# Patient Record
Sex: Female | Born: 1935 | Race: Black or African American | Hispanic: No | Smoking: Former smoker
Health system: Southern US, Community
[De-identification: ages and names within clinical notes are randomized; demographics above are authoritative.]

## PROBLEM LIST (undated history)

## (undated) DIAGNOSIS — E119 Type 2 diabetes mellitus without complications: Secondary | ICD-10-CM

## (undated) DIAGNOSIS — I10 Essential (primary) hypertension: Secondary | ICD-10-CM

## (undated) DIAGNOSIS — I639 Cerebral infarction, unspecified: Secondary | ICD-10-CM

## (undated) DIAGNOSIS — E785 Hyperlipidemia, unspecified: Secondary | ICD-10-CM

## (undated) DIAGNOSIS — K922 Gastrointestinal hemorrhage, unspecified: Secondary | ICD-10-CM

## (undated) DIAGNOSIS — I2510 Atherosclerotic heart disease of native coronary artery without angina pectoris: Secondary | ICD-10-CM

## (undated) HISTORY — PX: CORONARY ARTERY BYPASS GRAFT: SHX141

## (undated) HISTORY — PX: ABDOMINAL HYSTERECTOMY: SHX81

## (undated) HISTORY — PX: CORONARY ANGIOPLASTY WITH STENT PLACEMENT: SHX49

## (undated) SURGERY — MINOR CAPSULOTOMY
Anesthesia: Topical | Laterality: Left

---

## 1998-01-03 ENCOUNTER — Inpatient Hospital Stay (HOSPITAL_COMMUNITY): Admission: EM | Admit: 1998-01-03 | Discharge: 1998-01-09 | Payer: Self-pay | Admitting: Emergency Medicine

## 1998-08-21 ENCOUNTER — Inpatient Hospital Stay (HOSPITAL_COMMUNITY): Admission: AD | Admit: 1998-08-21 | Discharge: 1998-08-22 | Payer: Self-pay | Admitting: *Deleted

## 1998-11-06 ENCOUNTER — Ambulatory Visit (HOSPITAL_COMMUNITY): Admission: RE | Admit: 1998-11-06 | Discharge: 1998-11-06 | Payer: Self-pay | Admitting: Cardiology

## 1998-11-06 ENCOUNTER — Encounter: Payer: Self-pay | Admitting: Cardiology

## 1999-02-13 ENCOUNTER — Inpatient Hospital Stay (HOSPITAL_COMMUNITY): Admission: AD | Admit: 1999-02-13 | Discharge: 1999-02-25 | Payer: Self-pay | Admitting: Cardiology

## 1999-02-18 ENCOUNTER — Encounter: Payer: Self-pay | Admitting: Thoracic Surgery (Cardiothoracic Vascular Surgery)

## 1999-02-19 ENCOUNTER — Encounter: Payer: Self-pay | Admitting: Cardiology

## 1999-02-20 ENCOUNTER — Encounter: Payer: Self-pay | Admitting: Cardiology

## 1999-02-21 ENCOUNTER — Encounter: Payer: Self-pay | Admitting: Thoracic Surgery (Cardiothoracic Vascular Surgery)

## 1999-02-22 ENCOUNTER — Encounter: Payer: Self-pay | Admitting: Thoracic Surgery (Cardiothoracic Vascular Surgery)

## 2002-03-04 ENCOUNTER — Encounter: Payer: Self-pay | Admitting: Cardiology

## 2002-03-04 ENCOUNTER — Ambulatory Visit (HOSPITAL_COMMUNITY): Admission: RE | Admit: 2002-03-04 | Discharge: 2002-03-04 | Payer: Self-pay | Admitting: Cardiology

## 2002-03-10 ENCOUNTER — Ambulatory Visit (HOSPITAL_COMMUNITY): Admission: RE | Admit: 2002-03-10 | Discharge: 2002-03-10 | Payer: Self-pay | Admitting: Cardiology

## 2003-03-21 ENCOUNTER — Ambulatory Visit (HOSPITAL_COMMUNITY): Admission: RE | Admit: 2003-03-21 | Discharge: 2003-03-21 | Payer: Self-pay | Admitting: Gastroenterology

## 2003-03-21 ENCOUNTER — Encounter: Payer: Self-pay | Admitting: Gastroenterology

## 2003-03-22 ENCOUNTER — Encounter (HOSPITAL_COMMUNITY): Admission: RE | Admit: 2003-03-22 | Discharge: 2003-06-20 | Payer: Self-pay | Admitting: Gastroenterology

## 2003-03-29 ENCOUNTER — Encounter (INDEPENDENT_AMBULATORY_CARE_PROVIDER_SITE_OTHER): Payer: Self-pay | Admitting: Specialist

## 2003-03-29 ENCOUNTER — Ambulatory Visit (HOSPITAL_COMMUNITY): Admission: RE | Admit: 2003-03-29 | Discharge: 2003-03-29 | Payer: Self-pay | Admitting: Gastroenterology

## 2004-09-07 ENCOUNTER — Emergency Department (HOSPITAL_COMMUNITY): Admission: EM | Admit: 2004-09-07 | Discharge: 2004-09-07 | Payer: Self-pay | Admitting: *Deleted

## 2005-01-20 ENCOUNTER — Ambulatory Visit (HOSPITAL_COMMUNITY): Admission: RE | Admit: 2005-01-20 | Discharge: 2005-01-20 | Payer: Self-pay | Admitting: Cardiology

## 2005-02-06 ENCOUNTER — Ambulatory Visit (HOSPITAL_COMMUNITY): Admission: RE | Admit: 2005-02-06 | Discharge: 2005-02-06 | Payer: Self-pay | Admitting: Cardiology

## 2005-12-11 ENCOUNTER — Emergency Department (HOSPITAL_COMMUNITY): Admission: EM | Admit: 2005-12-11 | Discharge: 2005-12-11 | Payer: Self-pay | Admitting: Emergency Medicine

## 2005-12-22 ENCOUNTER — Inpatient Hospital Stay (HOSPITAL_COMMUNITY): Admission: EM | Admit: 2005-12-22 | Discharge: 2005-12-24 | Payer: Self-pay | Admitting: Emergency Medicine

## 2006-09-20 ENCOUNTER — Inpatient Hospital Stay (HOSPITAL_COMMUNITY): Admission: EM | Admit: 2006-09-20 | Discharge: 2006-09-21 | Payer: Self-pay | Admitting: Emergency Medicine

## 2006-11-07 ENCOUNTER — Inpatient Hospital Stay (HOSPITAL_COMMUNITY): Admission: EM | Admit: 2006-11-07 | Discharge: 2006-11-10 | Payer: Self-pay | Admitting: Emergency Medicine

## 2006-11-09 ENCOUNTER — Encounter (INDEPENDENT_AMBULATORY_CARE_PROVIDER_SITE_OTHER): Payer: Self-pay | Admitting: Cardiology

## 2007-11-17 ENCOUNTER — Emergency Department (HOSPITAL_COMMUNITY): Admission: EM | Admit: 2007-11-17 | Discharge: 2007-11-17 | Payer: Self-pay | Admitting: Emergency Medicine

## 2008-03-28 ENCOUNTER — Ambulatory Visit (HOSPITAL_COMMUNITY): Admission: RE | Admit: 2008-03-28 | Discharge: 2008-03-28 | Payer: Self-pay | Admitting: Ophthalmology

## 2008-04-05 ENCOUNTER — Encounter: Admission: RE | Admit: 2008-04-05 | Discharge: 2008-04-05 | Payer: Self-pay | Admitting: Gastroenterology

## 2008-04-10 ENCOUNTER — Ambulatory Visit (HOSPITAL_BASED_OUTPATIENT_CLINIC_OR_DEPARTMENT_OTHER): Admission: RE | Admit: 2008-04-10 | Discharge: 2008-04-10 | Payer: Self-pay | Admitting: Ophthalmology

## 2008-04-27 ENCOUNTER — Ambulatory Visit (HOSPITAL_COMMUNITY): Admission: RE | Admit: 2008-04-27 | Discharge: 2008-04-27 | Payer: Self-pay | Admitting: Ophthalmology

## 2008-08-04 ENCOUNTER — Ambulatory Visit (HOSPITAL_COMMUNITY): Admission: RE | Admit: 2008-08-04 | Discharge: 2008-08-04 | Payer: Self-pay | Admitting: Ophthalmology

## 2008-11-06 ENCOUNTER — Ambulatory Visit (HOSPITAL_COMMUNITY): Admission: RE | Admit: 2008-11-06 | Discharge: 2008-11-06 | Payer: Self-pay | Admitting: Ophthalmology

## 2009-02-26 ENCOUNTER — Emergency Department (HOSPITAL_COMMUNITY): Admission: EM | Admit: 2009-02-26 | Discharge: 2009-02-26 | Payer: Self-pay | Admitting: Emergency Medicine

## 2009-06-01 ENCOUNTER — Encounter (HOSPITAL_COMMUNITY): Admission: RE | Admit: 2009-06-01 | Discharge: 2009-07-27 | Payer: Self-pay | Admitting: Cardiology

## 2010-03-05 ENCOUNTER — Inpatient Hospital Stay (HOSPITAL_BASED_OUTPATIENT_CLINIC_OR_DEPARTMENT_OTHER): Admission: RE | Admit: 2010-03-05 | Discharge: 2010-03-05 | Payer: Self-pay | Admitting: Cardiology

## 2010-09-16 ENCOUNTER — Emergency Department (HOSPITAL_COMMUNITY)
Admission: EM | Admit: 2010-09-16 | Discharge: 2010-09-16 | Payer: Self-pay | Source: Home / Self Care | Admitting: Emergency Medicine

## 2010-12-09 LAB — POCT I-STAT GLUCOSE: Operator id: 221371

## 2010-12-31 ENCOUNTER — Emergency Department (HOSPITAL_COMMUNITY): Payer: Medicare Other

## 2010-12-31 ENCOUNTER — Emergency Department (HOSPITAL_COMMUNITY)
Admission: EM | Admit: 2010-12-31 | Discharge: 2010-12-31 | Disposition: A | Discharge status: Home / Self Care | Payer: Medicare Other | Attending: Emergency Medicine | Admitting: Emergency Medicine

## 2010-12-31 DIAGNOSIS — E789 Disorder of lipoprotein metabolism, unspecified: Secondary | ICD-10-CM | POA: Insufficient documentation

## 2010-12-31 DIAGNOSIS — Z8673 Personal history of transient ischemic attack (TIA), and cerebral infarction without residual deficits: Secondary | ICD-10-CM | POA: Insufficient documentation

## 2010-12-31 DIAGNOSIS — I10 Essential (primary) hypertension: Secondary | ICD-10-CM | POA: Insufficient documentation

## 2010-12-31 DIAGNOSIS — R0609 Other forms of dyspnea: Secondary | ICD-10-CM | POA: Insufficient documentation

## 2010-12-31 DIAGNOSIS — Z951 Presence of aortocoronary bypass graft: Secondary | ICD-10-CM | POA: Insufficient documentation

## 2010-12-31 DIAGNOSIS — I2510 Atherosclerotic heart disease of native coronary artery without angina pectoris: Secondary | ICD-10-CM | POA: Insufficient documentation

## 2010-12-31 DIAGNOSIS — Z79899 Other long term (current) drug therapy: Secondary | ICD-10-CM | POA: Insufficient documentation

## 2010-12-31 DIAGNOSIS — E119 Type 2 diabetes mellitus without complications: Secondary | ICD-10-CM | POA: Insufficient documentation

## 2010-12-31 DIAGNOSIS — R0989 Other specified symptoms and signs involving the circulatory and respiratory systems: Secondary | ICD-10-CM | POA: Insufficient documentation

## 2010-12-31 DIAGNOSIS — M6281 Muscle weakness (generalized): Secondary | ICD-10-CM | POA: Insufficient documentation

## 2010-12-31 DIAGNOSIS — Z7982 Long term (current) use of aspirin: Secondary | ICD-10-CM | POA: Insufficient documentation

## 2010-12-31 DIAGNOSIS — R112 Nausea with vomiting, unspecified: Secondary | ICD-10-CM | POA: Insufficient documentation

## 2010-12-31 DIAGNOSIS — R42 Dizziness and giddiness: Secondary | ICD-10-CM | POA: Insufficient documentation

## 2010-12-31 DIAGNOSIS — I252 Old myocardial infarction: Secondary | ICD-10-CM | POA: Insufficient documentation

## 2010-12-31 LAB — URINE MICROSCOPIC-ADD ON

## 2010-12-31 LAB — CBC
Hemoglobin: 10.6 g/dL — ABNORMAL LOW (ref 12.0–15.0)
MCV: 67.3 fL — ABNORMAL LOW (ref 78.0–100.0)
RBC: 5.14 MIL/uL — ABNORMAL HIGH (ref 3.87–5.11)

## 2010-12-31 LAB — URINALYSIS, ROUTINE W REFLEX MICROSCOPIC
Glucose, UA: NEGATIVE mg/dL
Hgb urine dipstick: NEGATIVE
Urobilinogen, UA: 0.2 mg/dL (ref 0.0–1.0)

## 2010-12-31 LAB — POCT CARDIAC MARKERS
CKMB, poc: 1.6 ng/mL (ref 1.0–8.0)
Myoglobin, poc: 63.4 ng/mL (ref 12–200)

## 2010-12-31 LAB — COMPREHENSIVE METABOLIC PANEL
AST: 31 U/L (ref 0–37)
Alkaline Phosphatase: 67 U/L (ref 39–117)
CO2: 20 mEq/L (ref 19–32)
Chloride: 108 mEq/L (ref 96–112)
GFR calc non Af Amer: 51 mL/min — ABNORMAL LOW (ref >60)
Glucose, Bld: 206 mg/dL — ABNORMAL HIGH (ref 70–99)
Potassium: 4.1 mEq/L (ref 3.5–5.1)
Sodium: 137 mEq/L (ref 135–145)
Total Protein: 7.6 g/dL (ref 6.0–8.3)

## 2010-12-31 LAB — PROTIME-INR
INR: 1.15 (ref 0.00–1.49)
Prothrombin Time: 14.9 seconds (ref 11.6–15.2)

## 2010-12-31 LAB — DIFFERENTIAL
Basophils Relative: 1 % (ref 0–1)
Lymphocytes Relative: 26 % (ref 12–46)
Monocytes Relative: 6 % (ref 3–12)
Neutro Abs: 3.7 10*3/uL (ref 1.7–7.7)

## 2010-12-31 LAB — POCT I-STAT, CHEM 8
Chloride: 107 mEq/L (ref 96–112)
Creatinine, Ser: 1.1 mg/dL (ref 0.4–1.2)
Hemoglobin: 12.6 g/dL (ref 12.0–15.0)
Sodium: 141 mEq/L (ref 135–145)

## 2010-12-31 MED ORDER — GADOBENATE DIMEGLUMINE 529 MG/ML IV SOLN
15.0000 mL | Freq: Once | INTRAVENOUS | Status: AC
  Administered 2010-12-31: 15 mL via INTRAVENOUS

## 2011-02-07 NOTE — Op Note (Signed)
[name], [name]:  [id number]   MEDICAL RECORD NO.:  [id number]          PATIENT TYPE:  AMB   LOCATION:  SDS                          FACILITY:  [facility]   PHYSICIAN:  Salley Scarlet., M.D.DATE OF BIRTH:  08/06/1931   DATE OF PROCEDURE:  DATE OF DISCHARGE:                               OPERATIVE REPORT   PREOPERATIVE DIAGNOSIS:  Immature cataract, right eye.   POSTOPERATIVE DIAGNOSIS:  Immature cataract, right eye.   OPERATION:  Kelman phacoemulsification cataract, right eye.   ANESTHESIA:  Local using Xylocaine 2% and Marcaine 0.75%.   JUSTIFICATION FOR PROCEDURE:  This is a 77 year old lady who underwent a  cataract extraction from the left eye several months ago.  She still  complains of blurred vision with difficulty to see and read.  She was  evaluated and found to have a visual acuity best corrected 20/50 on the  right, 20/30 on the left.  There was a 2+ nuclear sclerotic cataract of  the right eye with intraocular lens present on the left.  Cataract  extraction with intraocular lens implantation was recommended, and she  is admitted at this time for that purpose.   PROCEDURE:  Under influence of IV sedation Van Lint akinesia and  retrobulbar anesthesia was given.  The patient was prepped and draped in  the usual manner.  The lid speculum was inserted under the upper and  lower lid of the right eye and a 4-0 silk traction suture was passed  through the belly of the superior rectus muscle retraction.  A fornix-  based conjunctival flap was turned and hemostasis achieved using  cautery.  An incision made in the sclera at the limbus.  This incision  was dissected down into the cornea using a crescent blade.  A sideport  incision was made at 1:30 o'clock position.  OcuCoat was injected into  the eye through the sideport incision.  The anterior chamber was entered  through the corneoscleral tunnel incision at 11:35 o' clock position,  using a  2.75-mm keratome.  An anterior capsulotomy was done using a bent  25-gauge needle.  The nucleus was hydrodissected using Xylocaine.  The  KPE handpiece was passed into the eye and the nucleus was emulsified  without difficulty.  The residual cortical material was aspirated.  The  posterior capsule was polished using Olive Tip polisher.  The wound was  widened slightly to accommodate a foldable silicone lens.  The lens was  seated into the eye behind iris without difficulty.  The anterior  chamber was reformed and pupil constricted using Miochol.  The lips of  the wound were hydrated and tested to make sure that there was no leak.  After ascertaining that there was no leak, the conjunctiva was closed  over the wound using a thermal cautery.  About 1 mL of Celestone and 0.5  mL of gentamicin were injected into the subconjunctiva.  Maxitrol  ophthalmic ointment and prilocaine ointment were applied along with a  patch and Fox shield.  The patient tolerated the procedure well and was  discharged to the post anesthesia recovery  room in satisfactory condition.  She is instructed to rest today, to  take Darvocet-N 100 every 4 hours as needed for pain, and to see me in  office tomorrow for further evaluation.   DISCHARGE DIAGNOSIS:  Immature cataract, right eye.      Salley Scarlet., M.D.  Electronically Signed     TB/MEDQ  D:  08/05/2008  T:  08/05/2008  Job:  [id number]

## 2011-02-07 NOTE — Op Note (Signed)
[name], [name]:  [id number]   MEDICAL RECORD NO.:  [id number]          PATIENT TYPE:  AMB   LOCATION:  SDS                          FACILITY:  [facility]   PHYSICIAN:  Salley Scarlet., M.D.DATE OF BIRTH:  August 24, 1931   DATE OF PROCEDURE:  05/03/2008  DATE OF DISCHARGE:  04/27/2008                               OPERATIVE REPORT   PREOPERATIVE DIAGNOSIS:  Immature cataract left eye.   POSTOPERATIVE DIAGNOSIS:  Immature cataract left eye.   OPERATION:  Kelman phacoemulsification cataract left eye.   ANESTHESIA:  Local using Xylocaine 2% or Marcaine 0.75% with W[place]ase.   JUSTIFICATION FOR PROCEDURE:  This is a 77 year old lady who complains  of blurring of vision with difficulty seeing and read.  She is evaluated  and found to have a visual acuity best corrected to 20/50 on the right  and 20/60 on the left.  There were bilateral immature cataracts slightly  worse on the left than the right.  Cataract extraction with intraocular  lens implantation was recommended.  She is admitted at this time for  that purpose.   PROCEDURE:  Under influence of IV sedation of Van Lint akinesia,  retrobulbar anesthesia was given.  The patient was prepped and draped in  usual manner.  A lid speculum was inserted under the upper and lower lid  of the left eye and a 4-0 silk traction suture was passed through the  belly of the superior rectus muscle retraction.  A fornix-based  conjunctival flap was turned and hemostasis achieved using cautery.  An  incision was made in the sclera at the limbus.  This incision was  dissected down to clear cornea using crescent blade.  A sideport  incision made at 1:30 position.  OcuCoat was injected into the eye  through the sideport incision.  The anterior chamber was entered through  the corneoscleral tunnel incision at 11:30 position and an anterior  capsulotomy was done using a bent 25-gauge needle.  The nucleus was  hydrodissected  using Xylocaine.  The KPE handpiece was passed into the  eye.  The nucleus was emulsified without difficulty.  The residual  cortical material was aspirated.  The posterior capsule was polished  using olive-tip polisher.  The wound was widened slightly to accommodate  a foldable silicone lens.  The lens was seated into the eye behind the  iris without difficulty.  The anterior chamber was reformed and pupil  constricted using Miochol.  The lips of the wound was hydrated and  tested to make sure that there was no leak.  After ascertaining that  there was no leak, the conjunctiva was closed over the wound using  thermal cautery.  Celestone 1 mL and 0.5 mL gentamicin were injected  subconjunctivally.  Maxitrol ophthalmic ointment and prilocaine ointment  were applied along with a patch and Fox shield.  The patient tolerated  the procedure well and was discharged to the post anesthesia recovery  room in satisfactory condition.  She is instructed to rest today, to  take Vicodin every 4 hours as needed for pain and see me in the office  tomorrow for further evaluation.   DISCHARGED DIAGNOSIS:  Immature cataract left eye.     Salley Scarlet., M.D.  Electronically Signed    TB/MEDQ  D:  05/03/2008  T:  05/03/2008  Job:  [id number]

## 2011-02-07 NOTE — Cardiovascular Report (Signed)
NAMESADAE, [name]:  [id number]   MEDICAL RECORD NO.:  [id number]          PATIENT TYPE:  INP   LOCATION:  2927                         FACILITY:  [facility]   PHYSICIAN:  Ricki Rodriguez, M.D.  DATE OF BIRTH:  1931-04-03   DATE OF PROCEDURE:  09/21/2006  DATE OF DISCHARGE:  09/21/2006                            CARDIAC CATHETERIZATION   PROCEDURE:  Left heart catheterization, selective coronary angiography,  left ventricular function study and bypass graft study.   INDICATIONS:  This 77 year old black female with chest pain, coronary  artery disease, bypass graft x2, diabetes mellitus type 2 had  abnormal  EKG with lateral wall ischemia.   APPROACH:  Right femoral artery using a 5-French sheath and catheters.  A Smart needle was used for vascular access.   COMPLICATIONS:  None.   HEMODYNAMIC DATA:  The left ventricular pressure was 153/13 and aortic  pressure was 150/50.   CORONARY ANATOMY:  LEFT MAIN:  Left main coronary artery showed ostial  80% stenosis.   LEFT ANTERIOR DESCENDING CORONARY ARTERY:  The left anterior descending  coronary artery showed a mid-LAD post first septal origin 90% stenosis,  then diffuse narrowing, then competitive flow diagonal vessel had total  occlusion.   LEFT CIRCUMFLEX CORONARY ARTERY:  The left circumflex coronary had  ostial 70% and a total occlusion of the obtuse marginal branch 1 and 2.   RIGHT CORONARY ARTERY:  The right coronary artery had diffuse severe  disease with several 70-90% lesions and total occlusion post marginal 2  origin.  Marginal 1and 2 had diffuse disease.   SAPHENOUS VEIN GRAFT to right coronary artery had a total occlusion.   SAPHENOUS VEIN GRAFT to obtuse marginal branch had total occlusion from  previous study.   FREE RADIAL GRAFT to diagonal 2 was patent and diagonal 2 was  unremarkable and it supplied collateral to posterolateral and posterior  descending coronary artery.   LEFT  INTERNAL MAMMARY ARTERY TO LAD:  The left internal mammary artery  to LAD was widely patent and  mid to distal LAD disease of 40% severity  and it filled diagonal 1 by retrograde flow and sent collateral to  obtuse marginal branches.   IMPRESSION:  1. Severe left main and native vessel three-vessel coronary artery      disease.  2. Total occlusion of the saphenous vein graft to right coronary      artery and obtuse marginal branch.  3. Patent left anterior mammary artery.  4. Patent free radial to diagonal 2.   RECOMMENDATIONS:  This patient will continue medical therapy with Dr.  Orpah Cobb.      Ricki Rodriguez, M.D.  Electronically Signed     ASK/MEDQ  D:  11/04/2006  T:  11/04/2006  Job:  [id number]

## 2011-02-07 NOTE — Discharge Summary (Signed)
NAME:  [name], [name]:  [id number]   MEDICAL RECORD NO.:  [id number]          PATIENT TYPE:  INP   LOCATION:  3735                         FACILITY:  [facility]   PHYSICIAN:  [name], M.D. DATE OF BIRTH:  20-Sep-1931   DATE OF ADMISSION:  12/21/2005  DATE OF DISCHARGE:  12/24/2005                                 DISCHARGE SUMMARY   ADMITTING DIAGNOSES:  1.  Unstable angina, rule out myocardial infarction.  2.  Coronary artery disease.  History of coronary artery bypass graft x2.  3.  Hypertension.  4.  Diabetes mellitus.  5.  Hypercholesterolemia.  6.  History of gastrointestinal bleeding.   FINAL DIAGNOSES:  1.  Stable angina.  2.  Coronary artery disease status post coronary artery bypass grafting x2.  3.  Hypertension.  4.  Non-insulin-dependent diabetes mellitus.  5.  Hypercholesterolemia.  6.  History of cerebrovascular accident in the past.  7.  History of gastrointestinal bleeding.   DISCHARGE HOME MEDICATIONS:  1.  Toprol XL 50 mg one tablet daily.  2.  Altace 10 mg one capsule daily.  3.  Imdur 60 mg one tablet daily in the morning.  4.  Baby aspirin 81 mg one tablet daily.  5.  Actos 30 mg one tablet daily.  6.  Crestor 10 mg one capsule daily.  7.  Nitrostat 0.4 mg sublingual, use as directed.   DIET:  Low salt, low cholesterol 1800 calorie ADA diet.   Patient has been advised to monitor blood sugar and blood pressure daily.  Follow up with me in one week.   CONDITION AT DISCHARGE:  Stable.   BRIEF HISTORY AND HOSPITAL COURSE:  [name] is a 77 year old black  female with past medical history significant for coronary artery disease  status post CABG x2, hypertension, diabetes mellitus, hypercholesterolemia.  She came to ER complaining of substernal and left precordial chest pain.  She was recently seen in the EDP with unstable angina and was sent home  __________ received three sublingual nitro with some relief.   SOCIAL  HISTORY:  She lives alone at home.   FAMILY HISTORY:  Noncontributory.   PHYSICAL EXAMINATION:  VITAL SIGNS:  Blood pressure was 165/64, pulse was  88.  HEENT:  Conjunctiva was pink.  NECK:  No JVD.  No bruit.  LUNGS:  Clear to auscultation without rhonchi or rales.  CARDIOVASCULAR:  S1, S2 was normal.  There was no S3, gallop, or rub.  ABDOMEN:  Soft.  EXTREMITIES:  There is no edema.   EKG showed normal sinus rhythm with right bundle branch block pattern.  Hemoglobin A1c was 7.5.  CK was 113, MB 1.6.  Second set CK 117, MB 1.7.  Troponin I was 0.02 and 0.02.  Her potassium was 3.7, glucose 133, BUN 17,  creatinine 1.3.  Hemoglobin was 10.4, hematocrit 33.4, white count of 7.1  which has been stable.  Repeat cholesterol was 84, HDL was low 20, LDL 34.   BRIEF HOSPITAL COURSE:  Patient was admitted to telemetry unit.  MI was  ruled out by serial enzymes and EKG  __________.  There was no evidence of  reversible ischemia with EF of 65%.  Patient did not have any further  episodes rest of hospital stay.  Patient will be discharged home on above  medications and will be followed up in my office in one week.           ______________________________  Eduardo Osier. Sharyn Harrington, M.D.     MNH/MEDQ  D:  12/24/2005  T:  12/25/2005  Job:  [id number]

## 2011-02-07 NOTE — H&P (Signed)
[name], BETSILL NO.:  [id number]   MEDICAL RECORD NO.:  [id number]          PATIENT TYPE:  INP   LOCATION:  1825                         FACILITY:  [facility]   PHYSICIAN:  Ricki Rodriguez, M.D.  DATE OF BIRTH:  1931-05-11   DATE OF ADMISSION:  09/20/2006  DATE OF DISCHARGE:                              HISTORY & PHYSICAL   CHIEF COMPLAINT:  Chest pain.   HISTORY OF PRESENT ILLNESS:  This is a 77 year old black female with a  known history of coronary artery disease and bypass graft surgery x2,  along with hypertension, diabetes, hypercholesterolemia, who had chest  pain described as sharp over the left precordium, without shortness of  breath, sweating spell or radiation of the chest pain.  The patient had  an electrocardiogram showing ST depressions in the inferolateral leads.   The patient had partial relief of chest pain with nitroglycerin use.   PAST MEDICAL HISTORY:  1. Diabetes for 1 year.  2. Hypertension for 30 years.  3. Hypercholesterolemia for 2-3 years.   SOCIAL HISTORY:  The patient quit smoking 3 years ago.  Denies alcohol  use or drug use in the past.   PAST SURGICAL HISTORY:  1. Bypass graft surgery in 1981 and 2000.  2. Colonoscopy and endoscopy done 2-3 years ago by Dr. Loreta Ave showing a      polyp and gastritis respectively.   PERSONAL HISTORY:  The patient is a retired Advertising copywriter at [employer].  She  is widowed for 20 years.  She had 9 kids, 7 sons and 2 daughters.  One  of the sons died of heart enlargement at age 38.   MEDICATIONS:  1. Toprol XL 50 mg one daily.  2. Altace 10 mg one daily.  3. Imdur 60 mg one daily.  4. Baby aspirin 81 mg daily.  5. Actos 30 mg daily.  6. Crestor 10 mg daily.  7. Nitrostat 0.4 mg a tablet one sublingual q. 5 minutes x3 as needed      for chest pain.   ALLERGIES:  No known drug allergy.   REVIEW OF SYSTEMS:  The patient denies recent weight gain, weight loss,  vision trouble, hearing loss.   Rare history of chest pain.  Negative  exertional dyspnea.  Positive history of GI bleed requiring 2 units of  blood transfusion approximately 2 years ago.  A questionable history of  stroke.  No history of seizures or psychiatric admissions.  Positive  history of joint pains.   PHYSICAL EXAMINATION:  VITAL SIGNS:  Pulse 74, respirations 18, blood  pressure 172/76, temperature 97.7, oxygen saturation 100% on 2 liters of  oxygen.  HEENT:  The patient is normocephalic, atraumatic.  Wears a wig.  Conjunctivae are pink.  Sclerae are nonicteric.  NECK:  No JVD, no carotid bruit.  LUNGS:  Clear to auscultation.  HEART:  Normal S1-S2 without S3 gallop.  ABDOMEN:  Soft.  EXTREMITIES:  No edema.  CNS:  Cranial nerves grossly intact.  The patient moves all 4  extremities.   ELECTROCARDIOGRAM:  Pending.   LABORATORY DATA:  Pending.  IMPRESSION:  1. Unstable angina.  2. Coronary artery disease.  3. Coronary artery bypass graft surgery.  4. Diabetes mellitus.  5. Hypertension.   PLAN:  The plan is to admit the patient to the telemetry unit, rule out  myocardial infarction, continue home medications and schedule the  patient for cardiac catheterization on Monday.      Ricki Rodriguez, M.D.  Electronically Signed     ASK/MEDQ  D:  09/20/2006  T:  09/20/2006  Job:  [id number]

## 2011-02-07 NOTE — Cardiovascular Report (Signed)
[place]  Patient:    [name], [name] Visit Number: [id number] MRN: [id number]          Service Type: CAT Location: [facility] 2899 11 Attending Physician:  Robynn Pane Dictated by:   Eduardo Osier Sharyn Lull, M.D. Proc. Date: 03/10/02 Admit Date:  03/10/2002 Discharge Date: 03/10/2002   CC:         Cardiopulmonary Laboratory   Cardiac Catheterization  PROCEDURE: Left cardiac catheterization with selective left and right coronary angiography, visualization of saphenous vein graft and LIMA graft, left ventriculography via right groin using Judkins technique.  INDICATIONS FOR PROCEDURE: The patient is a 77 year old black female with a past medical history significant for coronary artery disease, history of multiple PTCAs and stenting in the past, history of coronary artery bypass grafting x2 in 1991 and then re-do CABG in May of 2000. She had initially saphenous vein graft to PDA and saphenous vein graft to diagonal #1, which was done in 1991 and subsequently had re-do CABG in May of 2000 when she had LIMA to LAD, saphenous vein graft to diagonal #1, left radial artery graft to diagonal #2 and saphenous vein graft to PDA. History of hypertension, peripheral vascular disease. She complains of retrosternal chest burning off and on with minimal exertion relieved with rest and sublingual nitroglycerin. Denies any nausea, vomiting, diaphoresis. Denies PND, orthopnea, or leg swelling. The patient underwent Persantine Cardiolite on March 04, 2002, which showed anterolateral wall ischemia with EF of 62%. Due to typical anginal chest pain, positive Persantine Cardiolite, multiple risk factors and significant prior coronary artery disease, the patient was advised for left catheterization, possible PTCA and stenting.  PAST MEDICAL HISTORY: As above.  PAST SURGICAL HISTORY: She had CABG x2 as stated above. She had cholecystectomy in 1974, partial  hysterectomy in 1974. Last catheterization was done in May of 2000 and last PTCA and stenting. She had PTCA and stenting to left circumflex in November of 1999 by Dr. Daisy Floro.  HOME MEDICATIONS: 1. She is on Altace 5 mg p.o. q.d. 2. Toprol-XL 50 mg p.o. q.d. 3. Enteric-coated aspirin 325 mg p.o. q.d. 4. Plavix 75 mg p.o. q.d. 5. Nexium 40 mg p.o. q.d. 6. ______ sublingual p.r.n.  SOCIAL HISTORY: She is single, retired. Smoked two packs-per-day for 30+ years, quit 3 years ago. No history of alcohol abuse. Worked at ______ in the past.  FAMILY HISTORY: Father died of MI at the age of 57, mother died of Alzheimers disease at the age of 81. She has two sisters who are diabetic.  PHYSICAL EXAMINATION:  GENERAL: On examination, she is awake, alert and oriented x3 in no acute distress.  VITAL SIGNS: Blood pressure is 120/70, pulse was 72, regular.  HEENT: Conjunctiva pink.  NECK: Supple, no JVD, no bruits.  LUNGS: Lungs are clear to auscultation without rhonchi or rales.  CARDIOVASCULAR: S1 and S2 are normal. There was S3 gallop. There was soft systolic murmur.  ABDOMEN: Soft. Bowel sounds are present, nontender.  EXTREMITIES: There is no clubbing, cyanosis or edema.  ECG showed normal sinus rhythm with right bundle-branch block with secondary ST-T wave changes. Due to recurrent chest pain, positive Persantine Cardiolite, and multiple risks factors, patient was advised for left catheterization, possible PTCA with stenting.  DESCRIPTION OF PROCEDURE: After obtaining the informed consent, the patient was brought to the catheterization lab and was placed on the fluoroscopy table.  The right groin was prepped and draped in the usual fashion. Xylocaine 2% was used  for local anesthesia in the right groin. With the help of a thin-walled needle, a 6 French arterial sheath was placed. The sheath was aspirated and flushed. Next, a 6 French left Judkins catheter was advanced over the  wire under fluoroscopic guidance up to the ascending aorta. The wire was pulled out, the catheter was aspirated and connected to the manifold. The catheter was further advanced and engaged into the left coronary ostium. Multiple views of the left system were taken. Next, the catheter was disengaged and was pulled out over the wire and was replaced with 6 French right Judkins catheter, which was advanced over the wire under fluoroscopic guidance up to the ascending aorta. The wire was pulled out, the catheter was aspirated and connected to the manifold. The catheter was further advanced and engaged into the right coronary ostium. A single view of the right coronary artery was obtained. Next, the catheter was disengaged and was engaged into left radial artery graft to diagonal #2 and multiple views of this graft were taken. Next, the catheter was disengaged and was engaged into the saphenous vein graft to diagonal #1, which was 100% occluded at the ostium. Next, the catheter was engaged into saphenous vein graft to PDA, which was also 100% occluded at the ostium. Next, the catheter was exchanged to a LIMA catheter and was engaged into LIMA to LAD. Multiple views of this graft were taken. Next, the catheter was disengaged and was pulled out over the wire and was replaced with a 6 French pigtail catheter, which was advanced over the wire under fluoroscopic guidance up to the ascending aorta. The catheter was further advanced across the aortic valve into the LV. LV pressures were recorded. Next, left ventriculography was done in 30-degree RAO position. Post angiographic pressures were recorded from LV and then pullback pressures were recorded from the aorta. There was no gradient across the aortic valve. Next, the pigtail catheter was pulled out over the wire. The sheaths were aspirated and flushed.  FINDINGS: LV showed good LV systolic function. EF of 55-60%.  Left main was patent proximally  but has 40-50% distal stenosis.   LAD has 85% ostial stenosis and 95% mid stenosis. Distally, LAD is filling by LIMA. Diagonal #1 and diagonal #2 were 100% occluded. Diagonal #3 is very small which is patent.  Left circumflex has 50-60% ostial stenosis and 70-80% long diffuse in-stent re-stenosis in the midportion. Distal vessel is small.  RCA has 70-80% proximal and mid long tubular stenosis and then is 100% occluded beyond the midportion. Saphenous vein graft to PDA is 100% occluded at the ostium. Saphenous vein graft to diagonal #1 is 100% occluded. Also, the old saphenous vein graft to diagonal #1 is also 100% occluded at the ostium. Left radial graft to diagonal #2 is patent. LIMA to LAD is patent.  The patient tolerated the procedure well. There were no complications.  The plan is to treat medially. If patient has recurrent episodes of chest pain, then will consider PTCA and stenting to left circumflex. Will continue with the Plavix, which was restarted recently to her regimen. Dictated by:   Eduardo Osier Sharyn Lull, M.D. Attending Physician:  Robynn Pane DD:  03/10/02 TD:  03/11/02 Job: [id number]

## 2011-02-07 NOTE — Discharge Summary (Signed)
[name], WINDSOR             ACCOUNT NO.:  [id number]   MEDICAL RECORD NO.:  [id number]          PATIENT TYPE:  INP   LOCATION:  5524                         FACILITY:  [facility]   PHYSICIAN:  Pramod P. Pearlean Brownie, MD    DATE OF BIRTH:  10-26-1930   DATE OF ADMISSION:  11/07/2006  DATE OF DISCHARGE:  11/10/2006                               DISCHARGE SUMMARY   ADMISSION DIAGNOSIS:  Transient ischemic attack.   DISCHARGE DIAGNOSES:  1. Right hemispheric transient ischemic attack.  2. Urinary tract infection.  3. Chronic bronchitis.  4. Ischemic heart disease.  5. Hypertension.  6. Diabetes.  7. Hyperlipidemia.   HOSPITAL COURSE:  Ms. Monger is a 77 year old pleasant African American  lady with multiple medical problems who presented with sudden onset of  left body numbness and weakness on the day of admission.  Her symptoms  were gradually improving by the time EMS brought her to the [facility]  emergency room.  She had only a left facial droop and mild left hand  weakness.  When seen in the emergency room by Dr. Nash Shearer, she had no  residual deficits, only minimal facial droop.  NIH Stroke Scale was 3 at  the time of admission.  CT scan of the head showed changes of small-  vessel disease and atrophy without acute abnormality.  She was admitted  to the stroke service for evaluation for TIA.  She was kept on telemetry  monitoring and no cardiac arrhythmias were found.  MRI scan of the brain  showed no evidence of acute infarct, only changes of small-vessel  disease and mild generalized atrophy.  A 2-D echo showed normal ejection  fraction.  There was slight hypokinesis of the inferior basal wall.  Her  hemoglobin A1c was normal.  Cardiac enzymes were normal, though she  complained of some chest pain and cardiology was consulted for  evaluation for chest pain, but they did not feel she had acute coronary  ischemia and she ruled out of MI.  Her total cholesterol was 85,  triglycerides were elevated at 240, HDL 17, LDL was 20.  Homocystine was  slightly elevated.  Her white counts remained normal throughout the  hospital stay.  She was started on Levaquin for a urinary tract  infection.  on the 18th at 7:30 p.m. she spiked 103 degrees fever.  At  this time blood cultures were sent and chest x-ray was obtained.  Chest  x-ray showed changes of chronic bronchitis.  Blood cultures were pending  at the time of discharge.  On day of discharge the temperature had come  down to 100 and she was feeling much better.  She requested to be  discharged home.  She is advised to take a 1-week course of Levaquin and  follow up with her primary physician, Dr. Sharyn Lull, in a couple of days  if her fever returned or she did not feel better.  She may need to  changes in her antibiotics or further medical evaluation by Dr. Sharyn Lull.  She was  advised to stay on aspirin and Plavix for her coronary artery disease  as  well as TIA.  She was advised to follow up and she was started on Foltx  for her elevated homocystine.  She will follow up with Dr. Sharyn Lull in 2-  3 days and Dr. Pearlean Brownie in 2-3 months.           ______________________________  Sunny Schlein. Pearlean Brownie, MD     PPS/MEDQ  D:  11/10/2006  T:  11/10/2006  Job:  [id number]   cc:   Eduardo Osier. Sharyn Lull, M.D.

## 2011-02-07 NOTE — Op Note (Signed)
   [name], [name]                       ACCOUNT NO.:  [id number]   MEDICAL RECORD NO.:  [id number]                   PATIENT TYPE:  AMB   LOCATION:  ENDO                                 FACILITY:  [facility]   PHYSICIAN:  Anselmo Rod, M.D.               DATE OF BIRTH:  1931-03-04   DATE OF PROCEDURE:  03/29/2003  DATE OF DISCHARGE:                                 OPERATIVE REPORT   PROCEDURE PERFORMED:  Esophagogastroduodenoscopy.   ENDOSCOPIST:  Charna Elizabeth, M.D.   INSTRUMENT USED:  Olympus video panendoscope.   INDICATIONS FOR PROCEDURE:  The patient is a 77 year old African-American  female with iron deficiency anemia, hemoglobin down to 6.9g/dl.  She  received two units of packed red blood cells and hemoglobin rechecked at  9________ Rule out peptic ulcer disease, esophagitis, gastric or duodenal  arteriovenous malformations, masses, etc.   PREPROCEDURE PREPARATION:  Informed consent was procured from the patient.  The patient was fasted for eight hours prior to the procedure.   PREPROCEDURE PHYSICAL:  The patient had stable vital signs.  Neck supple,  chest clear to auscultation.  Abdomen soft with normal bowel sounds.   DESCRIPTION OF PROCEDURE:  The patient was placed in the left lateral  decubitus position and sedated with 40 mg of Demerol and 4 mg of Versed  intravenously.  Once the patient was adequately sedated and maintained on  low-flow oxygen and continuous cardiac monitoring, the Olympus video  panendoscope was advanced through the mouth piece over the tongue into the  esophagus under direct vision.  The entire esophagus appeared normal with no  evidence of ring, stricture, masses, esophagitis or Barrett's mucosa.  On  advancing the scope into the stomach, mild diffuse gastritis was noted.  No  erosions, ulcerations, masses or polyps were present.  Duodenal bulb and the  proximal small bowel distal to the bulb up to 60 cm appeared normal.  No  ulcers or  erosions were noted in the gastric mucosa as well.   IMPRESSION:  Mild diffuse gastritis, otherwise normal  esophagogastroduodenoscopy.    RECOMMENDATIONS:  Proceed with a colonoscopy at this time.  Further  recommendations made after the procedure has been done.                                                Anselmo Rod, M.D.    JNM/MEDQ  D:  03/29/2003  T:  03/30/2003  Job:  [id number]   cc:   Eduardo Osier. Sharyn Lull, M.D.  [address]  [place]  [place] [address]  Fax: [phone]

## 2011-02-07 NOTE — Cardiovascular Report (Signed)
[name], [name]:  [id number]   MEDICAL RECORD NO.:  [id number]          PATIENT TYPE:  OIB   LOCATION:  2899                         FACILITY:  [facility]   PHYSICIAN:  Eduardo Osier. Sharyn Lull, M.D. DATE OF BIRTH:  [birth year]   DATE OF PROCEDURE:  02/06/2005  DATE OF DISCHARGE:                              CARDIAC CATHETERIZATION   PROCEDURE:  1.  Left cardiac catheterization with selective left and right coronary      angiography.  2.  Left ventriculography.  3.  Visualization of saphenous vein graft, free radial artery graft, and      left internal mammary artery to left anterior descending via right groin      using Judkins technique.   INDICATIONS FOR THE PROCEDURE:  [name] is a 76 year old black female  with past medical history significant for coronary artery disease status  post CABG x2 in 1991 and then in redo CABG in 2000.  She had LIMA to LAD,  saphenous vein graft to diagonal 1, saphenous vein graft to PDA, left radial  artery graft to diagonal 2.  Had multiple PCIs in 1996, history of  hypertension, chronic hypochromic anemia, history of gastritis, colonic  polyp, anxiety disorder.  Complains of retrosternal chest pain off and on,  lasting few minutes without associated symptoms of nausea, vomiting,  diaphoresis.  Denies PND, orthopnea, leg swelling.  Denies palpitation,  lightheadedness, or syncope.  Patient underwent Persantine Myoview on Jan 20, 2005 which showed small area of reversible ischemia in the anterolateral  wall with EF of 58%.  Due to typical anginal chest pain, multiple risk  factors, and mildly positive Persantine Myoview discussed with the patient  regarding left catheterization, possible PTCA and stenting, its risks, i.e.,  death, MI, stroke, need for emergency CABG, risk of restenosis, local  vascular complications, etc. and consented for the procedure.   PROCEDURE:  After obtaining the informed consent patient was brought to  the  catheterization laboratory and was placed on fluoroscopy table.  Right groin  was prepped and draped in usual fashion.  2% Xylocaine was used for local  anesthesia in the right groin.  With the help of thin wall needle 6-French  arterial sheath was placed.  The sheath was aspirated and flushed.  Next 6-  French left Judkins catheter was advanced over the wire under fluoroscopic  guidance up to the ascending aorta where it was pulled out.  The catheter  was aspirated and connected to the manifold.  Catheter was further advanced  and engaged into left coronary ostium.  Multiple views of the left system  were taken.  Next, the catheter was disengaged and was pulled out over the  wire and was replaced with 6-French right Judkins catheter which was  advanced over the wire under fluoroscopic guidance up to the ascending aorta  where it was pulled out.  The catheter was aspirated and connected to the  manifold.  Catheter was further advanced and engaged into right coronary  ostium.  A single view of right coronary artery was obtained.  Next, the  catheter was disengaged and  was engaged into saphenous vein graft to PDA.  Multiple views of this graft were taken.  This graft was occluded at the  ostium as before.  Next, this catheter was engaged into saphenous vein graft  to diagonal 1 which was also occluded at the ostium as before.  Next, this  catheter was engaged into free radial artery graft to diagonal 2.  Multiple  views of this graft were taken.  Next, the catheter was disengaged and was  engaged into LIMA to LAD.  Multiple views of this graft were taken.  Next,  the catheter was disengaged and was pulled out over the wire and was  replaced with 6-French left bypass diagnostic catheter which was advanced  over the wire under fluoroscopic guidance up to the ascending aorta where it  was pulled out.  The catheter was aspirated and connected to the manifold.  Catheter was further advanced  ascending aorta. Wire was pulled out.  The  catheter was aspirated and connected to the manifold.  Next, this catheter  was again attempted to engage into saphenous vein graft to diagonal 1 which  was occluded at the ostium.  Next, this catheter was pulled out over the  wire and was replaced with 6-French pigtail catheter which was advanced over  the wire under fluoroscopic guidance up to the ascending aorta where it was  pulled out.  The catheter was aspirated and connected to the manifold.  Catheter was further advanced across the aortic valve into the LV.  LV  pressures were recorded.  Next, LV graphy was done in 30 degree RAO  position.  Post angiographic pressures were recorded from LV and then  pullback pressures were recorded from the aorta.  There was no gradient  across the aortic valve.  Next, the pigtail catheter was pulled out over the  wire.  Sheaths were aspirated and flushed.   FINDINGS:  LV showed good LV systolic function, LVH, EF of 28-41%.  Left  main has 50-60% distal stenosis.  Diagonal 1 has 60-70% ostial stenosis and  99% mid stenosis.  Distally vessel is 100% occluded which is filling by  internal mammary artery.  Left circumflex has 75-80% ostial stenosis and 99%  mid in-stent restenosis.  Distally the vessel is small and has TIMI II flow.  RCA has 80-85% multiple sequential stenosis in proximal and mid portion and  then is 100% occluded.  Saphenous vein graft to PDA and saphenous vein graft  to OM1 were 100% occluded at the ostium as before.  Free radial artery graft  to diagonal II was patent.  LIMA to LAD was patent.  Patient tolerated  procedure well.  There were no complications.  Patient was transferred to  recovery room in stable condition.  Plan is to maximize anti-anginal  medications.      MNH/MEDQ  D:  02/06/2005  T:  02/06/2005  Job:  [id number]   cc:   Cath Lab

## 2011-02-07 NOTE — Discharge Summary (Signed)
[name], [name]:  [id number]   MEDICAL RECORD NO.:  [id number]          PATIENT TYPE:  INP   LOCATION:  2927                         FACILITY:  [facility]   PHYSICIAN:  Ricki Rodriguez, M.D.  DATE OF BIRTH:  [birth year]   DATE OF ADMISSION:  09/20/2006  DATE OF DISCHARGE:  09/21/2006                               DISCHARGE SUMMARY   PRINCIPAL DIAGNOSES:  1. Native vessel coronary atherosclerosis.  2. Intermediate coronary syndrome.  3. Hypertension.  4. Diabetes mellitus, type 2.  5. Pure hypercholesterolemia.  6. Coronary atherosclerosis of autologous vein bypass grafts.   DISCHARGE DIET:  Low-fat, low-salt diet and a diabetic diet.   DISCHARGE ACTIVITY:  The patient is to increase activity slowly.   WOUND CARE INSTRUCTIONS:  The patient is to notify for right groin pain,  swelling, or discharge.   DISCHARGE MEDICATIONS:  1. Aspirin 81 mg 1 daily.  2. Toprol XL 50 mg 1 daily.  3. Altace 10 mg 1 daily.  4. Crestor 10 mg 1 daily.  5. Glucotrol XL 5 mg 1 daily.  6. Imdur 60 mg 1 daily.   FOLLOWUP:  By Dr. Rinaldo Cloud in 2 weeks.  The patient to call [phone] for an appointment.   SPECIAL INSTRUCTIONS:  The patient is to discontinue Actos.   HISTORY:  This 77 year old black female with a known history of coronary  artery disease and bypass graft surgery x2 had sharp left precordial  chest pain without shortness of breath, nausea or radiation of the chest  pain.  The patient had an EKG that showed ST depressions in the inferior  lateral leads and had a partial relief of chest pain with nitroglycerin  use.  She has past medical history of diabetes for 1 year, hypertension  for 30 years, and hypercholesterolemia for 2-3 years.   PHYSICAL EXAMINATION:  VITAL SIGNS:  Pulse 74, respirations 18, blood  pressure 172/76, temperature 97.7, O2 saturation 100% on 2 liters of  oxygen.  HEENT:  The patient is normocephalic, atraumatic.  Wears wig.  Conjunctivae pink, sclerae anicteric.  NECK:  No JVD, no carotid bruits.  LUNGS:  Clear bilaterally.  HEART:  Normal S1, S2 without S3 or gallop.  ABDOMEN:  Soft and nontender.  EXTREMITIES:  No edema, cyanosis, or clubbing.  CNS:  Cranial nerves grossly intact.  The patient moves all 4  extremities.   LABORATORY DATA:  Revealed normal hemoglobin and hematocrit, normal WBC  count and platelet count.  Normal electrolytes, BUN and creatinine.  She  was borderline diabetic at 125 to 129.  Hemoglobin A1C slightly high at  7.8.  CK-MB, troponin I negative x2.   EKG:  Sinus rhythm with right bundle branch block and nonspecific T wave  changes.   Cardiac catheterization showed severe native vessel disease with patent  LIMA to LAD and free radial to diagonal 2.   HOSPITAL COURSE:  The patient was admitted to telemetry unit.  Myocardial infarction was ruled out.  Due to chest pain, EKG changes of  ischemia, and cardiac risk factors, the patient underwent cardiac  catheterization  that showed chronic native vessel and bypass graft  disease.  Since her LIMA and free radial graft diagonal were patent, and  there were good collateral vessels to obtuse marginal branch and right  coronary artery.  It was decided to continue the patient on medical  therapy.  She was discharged home in satisfactory condition with  followup by Dr. Sharyn Lull in 2 weeks.      Ricki Rodriguez, M.D.  Electronically Signed     ASK/MEDQ  D:  11/04/2006  T:  11/05/2006  Job:  [id number]

## 2011-02-07 NOTE — Op Note (Signed)
NAME:  [name], [name]                       ACCOUNT NO.:  [id number]   MEDICAL RECORD NO.:  [id number]                   PATIENT TYPE:  AMB   LOCATION:  ENDO                                 FACILITY:  [facility]   PHYSICIAN:  Anselmo Rod, M.D.               DATE OF BIRTH:  12/30/1930   DATE OF PROCEDURE:  03/29/2003  DATE OF DISCHARGE:                                 OPERATIVE REPORT   PROCEDURE PERFORMED:  Colonoscopy with snare polypectomy times one.   ENDOSCOPIST:  Charna Elizabeth, M.D.   INSTRUMENT USED:  Olympus video colonoscope.   INDICATIONS FOR PROCEDURE:  The patient is a 77 year old African-American  female with an iron deficiency anemia and essentially unrevealing  esophagogastroduodenoscopy except for mild diffuse gastritis.  Rule out  colonic polyps, masses, etc.   PREPROCEDURE PREPARATION:  Informed consent was procured from the patient.  The patient was fasted for eight hours prior to the procedure and prepped  with a bottle of magnesium citrate and a gallon of GoLYTELY the night prior  to the procedure.   PREPROCEDURE PHYSICAL:  The patient had stable vital signs except for  elevated blood pressures of 204/120.  Neck supple.  Chest clear to  auscultation.  S1 and S2 regular.  Abdomen soft with normal bowel sounds.   DESCRIPTION OF PROCEDURE:  The patient was placed in left lateral decubitus  position and sedated with an additional 20 mg of Demerol and 2 mg of Versed  intravenously.  Once the patient was adequately sedated and maintained on  low flow oxygen and continuous cardiac monitoring, the Olympus video  colonoscope was advanced from the rectum to the cecum with extreme  difficulty.  There was a large amount of residual stool in the colon.  Visualization was difficult.  The patient had a very poor prep.  The  appendicular orifice was recognized and so was the ileocecal valve.  A  submucosal lesion was seen in the proximal right colon.  This did not  appear  to be typical of a lipoma but as this was submucosal, no biopsies were done.  Prominent internal hemorrhoids were seen on retroflexion in the rectum.  A  large polyp was snared from 70 cm.  There was no evidence of diverticulosis.  Small lesions could have been missed.   IMPRESSION:  1. Prominent internal hemorrhoids.  2. Large polyp snared at 70 cm.  3. Submucosal lesion, proximal right colon, question lipoma.  4. Large amount of residual stool in the colon, small lesions could have     been missed.   RECOMMENDATIONS:  1. Await pathology results.  2. Avoid all nonsteroidals including aspirin for the next 10 days.  3. Outpatient follow-up in the next two weeks for further recommendations.  Anselmo Rod, M.D.    JNM/MEDQ  D:  03/29/2003  T:  03/30/2003  Job:  [id number]   cc:   Eduardo Osier. Sharyn Lull, M.D.  [address]  [place]  [place] [address]  Fax: [phone]

## 2011-02-07 NOTE — H&P (Signed)
NAMEGREENLEIGH, KAUTH NO.:  [id number]   MEDICAL RECORD NO.:  [id number]          PATIENT TYPE:  INP   LOCATION:  5524                         FACILITY:  [facility]   PHYSICIAN:  Bevelyn Buckles. [place], M.D.DATE OF BIRTH:  January 08, 1931   DATE OF ADMISSION:  11/07/2006  DATE OF DISCHARGE:                              HISTORY & PHYSICAL   REQUESTING PHYSICIAN:  Dr. Freida Busman.   REASON FOR ADMISSION:  Code strip.   HISTORY OF PRESENT ILLNESS:  Ms. Fister is a 77 year old African  Amercian female with multiple medical problems who presents after acute  onset today around 5 p.m. of left hemiparesis and numbness.  Patient's  symptoms have gradually improved and resolved.  When EMS arrived to  patient's house, patient had left facial droop and slight left lower  extremity weakness.  Initially patient also had some difficulty with  speech which has also gradually improved.  Patient's symptoms continued  for awhile in the ED.  Patient states she is back to normal now.  She  denies any other symptoms of dizziness, vision changes, swallowing  problems, chewing problems, headaches, falls or loss of consciousness.   PAST MEDICAL HISTORY:  Positive for heart disease, hypertension,  diabetes, high cholesterol.   CURRENT MEDICATIONS:  Include Altace, Crestor, glipizide, isosorbide,  metoprolol.   ALLERGIES:  PATIENT HAS NO KNOWN DRUG ALLERGIES.   FAMILY HISTORY:  Positive for diabetes, heart disease and stroke.   SOCIAL HISTORY:  Patient lives with her son.  Denies any tobacco or  alcohol use.   REVIEW OF SYSTEMS:  Positive as per HPI and also with chest pain,  shortness of breath.  Negative as per HPI, greater than in eight other  organ systems.   PHYSICAL EXAMINATION:  VITAL SIGNS:  Temperature is 98.8, blood pressure  is 120/72, pulse is 67, respirations 20, O2 saturation is 100%.  HEENT:  Normocephalic and atraumatic.  Extraocular muscles are intact.  Pupils are equal, round  and reactive to light.  NECK:  Supple, no carotid bruits.  HEART:  Regular.  LUNGS:  Clear.  ABDOMEN:  Soft and nontender.  EXTREMITIES:  Show good pulses.  NEUROLOGICAL EXAMINATION:  Patient is awake, alert.  Language is fluent.  Patient is following commands appropriately.  Patient has a questionable  slight right facial droop.  Cranial nerves II-XII are grossly intact.  Motor examination:  Patient has slight left drift, however, good  strength bilaterally and normal tone.  Sensation is within normal limits  to light touch, pinprick and no extinction nor neglect is noted.  Reflexes are 1 to 2+ and symmetric.  Cerebellar function is within  normal limits, finger-to-nose, heel-to-shin.  NIH stroke scale initially  is 3.  Gait was not assessed secondary to safety.  CT of the head showed  atrophy and small vessel disease, no acute findings.   LABORATORY DATA:  WBC 6.7, hemoglobin 10.8, hematocrit 35.3, platelets  193.   IMPRESSION:  This is a 77 year old African American female with  transient left-sided weakness and numbness which is now resolved.  Transient ischemic attack versus small vessel stroke.   PLAN:  Patient is not a candidate for IVTPA as symptoms are greatly  improved and resolved.  We will admit the patient to the stroke MD  service.  Check an MRI and MRA, 2-D echo, carotid Dopplers, lipids and  homocystine levels along with hemoglobin.  We will keep the patient  n.p.o. until she clears swallow evaluation, place her on IV fluids.  Get  PT, OT and speech consults.  We will start an aspirin per day.  We will  continue her other home medications.  We will place her on DVT  prophylaxis.  We will also check cardiac enzymes x3.  We will follow the  patient while she is in the hospital.      Bevelyn Buckles. Nash Shearer, M.D.  Electronically Signed     DRC/MEDQ  D:  11/07/2006  T:  11/08/2006  Job:  [id number]

## 2011-06-13 LAB — COMPREHENSIVE METABOLIC PANEL
ALT: 18
AST: 32
Albumin: 3.5
Alkaline Phosphatase: 86
BUN: 8
CO2: 26
Calcium: 9
Chloride: 102
Creatinine, Ser: 0.91
GFR calc Af Amer: >60
GFR calc non Af Amer: >60
Glucose, Bld: 131 — ABNORMAL HIGH
Potassium: 3.6
Sodium: 139
Total Bilirubin: 0.7
Total Protein: 8.1

## 2011-06-13 LAB — DIFFERENTIAL
Basophils Absolute: 0
Basophils Relative: 0
Eosinophils Absolute: 0.1
Eosinophils Relative: 2
Lymphocytes Relative: 27
Lymphs Abs: 1.5
Monocytes Absolute: 0.4
Monocytes Relative: 8
Neutro Abs: 3.6
Neutrophils Relative %: 63

## 2011-06-13 LAB — POCT CARDIAC MARKERS
CKMB, poc: 1.3
CKMB, poc: 1.6
Myoglobin, poc: 56.9
Myoglobin, poc: 62.9
Operator id: 288831
Operator id: 288831
Troponin i, poc: <0.05
Troponin i, poc: <0.05

## 2011-06-13 LAB — CBC
HCT: 38.8
Hemoglobin: 12.1
MCHC: 31.1
MCV: 66.1 — ABNORMAL LOW
Platelets: 195
RBC: 5.87 — ABNORMAL HIGH
RDW: 16.4 — ABNORMAL HIGH
WBC: 5.6

## 2011-06-13 LAB — URINALYSIS, ROUTINE W REFLEX MICROSCOPIC
Bilirubin Urine: NEGATIVE
Glucose, UA: NEGATIVE
Hgb urine dipstick: NEGATIVE
Ketones, ur: NEGATIVE
Nitrite: NEGATIVE
Protein, ur: 100 — AB
Specific Gravity, Urine: 1.013
Urobilinogen, UA: 0.2
pH: 6

## 2011-06-13 LAB — LIPASE, BLOOD: Lipase: 27

## 2011-06-13 LAB — URINE MICROSCOPIC-ADD ON

## 2011-06-19 LAB — URINALYSIS, ROUTINE W REFLEX MICROSCOPIC
Protein, ur: NEGATIVE
Specific Gravity, Urine: 1.022
Urobilinogen, UA: 0.2

## 2011-06-19 LAB — URINE MICROSCOPIC-ADD ON

## 2011-06-19 LAB — CBC
MCHC: 31.4
RBC: 4.94
RDW: 14.9

## 2011-06-19 LAB — BASIC METABOLIC PANEL
CO2: 25
Calcium: 8.6
Creatinine, Ser: 1.03
GFR calc Af Amer: >60
GFR calc non Af Amer: 52 — ABNORMAL LOW
Glucose, Bld: 111 — ABNORMAL HIGH

## 2011-06-20 LAB — CBC
HCT: 35.9 — ABNORMAL LOW
MCHC: 31
MCV: 67.2 — ABNORMAL LOW
Platelets: 202
RDW: 15.3
WBC: 6.6

## 2011-06-20 LAB — BASIC METABOLIC PANEL
BUN: 15
CO2: 22
Chloride: 110
Glucose, Bld: 104 — ABNORMAL HIGH
Potassium: 3.5

## 2011-06-20 LAB — URINALYSIS, ROUTINE W REFLEX MICROSCOPIC
Ketones, ur: NEGATIVE
Nitrite: NEGATIVE
pH: 5

## 2011-06-20 LAB — URINE MICROSCOPIC-ADD ON

## 2011-06-24 LAB — BASIC METABOLIC PANEL
CO2: 25
Calcium: 8.9
GFR calc Af Amer: 58 — ABNORMAL LOW
GFR calc non Af Amer: 48 — ABNORMAL LOW
Sodium: 138

## 2011-06-24 LAB — URINE MICROSCOPIC-ADD ON

## 2011-06-24 LAB — URINALYSIS, ROUTINE W REFLEX MICROSCOPIC
Glucose, UA: NEGATIVE
Hgb urine dipstick: NEGATIVE
Specific Gravity, Urine: 1.022
pH: 5.5

## 2011-06-24 LAB — CBC
Hemoglobin: 11.5 — ABNORMAL LOW
MCHC: 30.6
RBC: 5.47 — ABNORMAL HIGH

## 2011-06-24 LAB — GLUCOSE, CAPILLARY: Glucose-Capillary: 118 — ABNORMAL HIGH

## 2011-09-03 ENCOUNTER — Other Ambulatory Visit: Payer: Self-pay | Admitting: Cardiology

## 2011-10-15 ENCOUNTER — Other Ambulatory Visit: Payer: Self-pay | Admitting: Cardiology

## 2011-11-04 ENCOUNTER — Other Ambulatory Visit: Payer: Self-pay | Admitting: Cardiology

## 2011-11-09 ENCOUNTER — Emergency Department (HOSPITAL_COMMUNITY)
Admission: EM | Admit: 2011-11-09 | Discharge: 2011-11-09 | Disposition: A | Discharge status: Home / Self Care | Payer: Medicare Other | Attending: Emergency Medicine | Admitting: Emergency Medicine

## 2011-11-09 ENCOUNTER — Encounter (HOSPITAL_COMMUNITY): Payer: Self-pay

## 2011-11-09 DIAGNOSIS — F17200 Nicotine dependence, unspecified, uncomplicated: Secondary | ICD-10-CM | POA: Insufficient documentation

## 2011-11-09 DIAGNOSIS — L02211 Cutaneous abscess of abdominal wall: Secondary | ICD-10-CM

## 2011-11-09 DIAGNOSIS — Z79899 Other long term (current) drug therapy: Secondary | ICD-10-CM | POA: Insufficient documentation

## 2011-11-09 DIAGNOSIS — R1909 Other intra-abdominal and pelvic swelling, mass and lump: Secondary | ICD-10-CM | POA: Insufficient documentation

## 2011-11-09 DIAGNOSIS — L02219 Cutaneous abscess of trunk, unspecified: Secondary | ICD-10-CM | POA: Insufficient documentation

## 2011-11-09 DIAGNOSIS — R109 Unspecified abdominal pain: Secondary | ICD-10-CM | POA: Insufficient documentation

## 2011-11-09 NOTE — ED Provider Notes (Signed)
History     CSN: [id number]  Arrival date & time 11/09/11  1110   First MD Initiated Contact with Patient 11/09/11 1128      Chief Complaint  Patient presents with  . Abscess    (Consider location/radiation/quality/duration/timing/severity/associated sxs/prior treatment) Patient is a 78 y.o. female presenting with abscess. The history is provided by the patient and a relative.  Abscess    the patient reports swelling of her right upper abdomen under her right breast for several days.  This is a small focal area.  She thinks it may be an abscess.  She denies fevers or chills.  She denies spreading erythema.  She reports it is painful to touch.  She's had no drainage from this area.  He's never had an abscess before.  Nothing improves her symptoms.  Her symptoms are worsened by palpation.  Her symptoms are constant.  Her pain is mild.  History reviewed. No pertinent past medical history.  No past surgical history on file.  No family history on file.  History  Substance Use Topics  . Smoking status: Current Everyday Smoker  . Smokeless tobacco: Not on file  . Alcohol Use: No    OB History    Grav Para Term Preterm Abortions TAB SAB Ect Mult Living                  Review of Systems  All other systems reviewed and are negative.    Allergies  Review of patient's allergies indicates no known allergies.  Home Medications   Current Outpatient Rx  Name Route Sig Dispense Refill  . AMLODIPINE BESYLATE 2.5 MG PO TABS Oral Take 2.5 mg by mouth daily.    . ATENOLOL 25 MG PO TABS Oral Take 25 mg by mouth daily.    [name] GLIPIZIDE ER 5 MG PO TB24 Oral Take 5 mg by mouth 2 (two) times daily.    . ISOSORBIDE MONONITRATE ER 30 MG PO TB24 Oral Take 60 mg by mouth daily.    [name] RAMIPRIL 10 MG PO CAPS Oral Take 10 mg by mouth daily.    [name] ROSUVASTATIN CALCIUM 10 MG PO TABS Oral Take 10 mg by mouth daily.      BP 129/57  Pulse 67  Temp(Src) 98.3 F (36.8 C) (Oral)  Resp 19  Ht 5'  4" (1.626 m)  Wt 150 lb (68.04 kg)  BMI 25.75 kg/m2  SpO2 98%  Physical Exam  Constitutional: She is oriented to person, place, and time. She appears well-developed and well-nourished.  HENT:  Head: Normocephalic.  Eyes: EOM are normal.  Neck: Normal range of motion.  Pulmonary/Chest: Effort normal.  Abdominal:       Patient with small focal diamond size swelling of her right upper abdomen just under her right breast fold.  There is no spreading erythema.  It is mildly tender.  There is no fluctuance.  There appears to be a blackhead in the middle of it.  There is swelling surrounding this  Musculoskeletal: Normal range of motion.  Neurological: She is alert and oriented to person, place, and time.  Psychiatric: She has a normal mood and affect.    ED Course  Procedures (including critical care time)  INCISION AND DRAINAGE Performed by: Lyanne Co Consent: Verbal consent obtained. Risks and benefits: risks, benefits and alternatives were discussed Time out performed prior to procedure Type: abscess Body area: Right upper abdomen under right breast fold Anesthesia: local infiltration Local anesthetic: lidocaine 2 %  with epinephrine Anesthetic total: 2 ml Complexity: complex Blunt dissection to break up loculations Drainage: purulent Drainage amount: Small  Packing material: 1/4 in iodoform gauze Patient tolerance: Patient tolerated the procedure well with no immediate complications.     Labs Reviewed - No data to display No results found.   1. Abscess of skin of abdomen       MDM  Appears to be small infected blackhead.  I&D at bedside.  Warm compresses.  Infection warnings given        Lyanne Co, MD 11/09/11 1150

## 2011-11-09 NOTE — ED Notes (Signed)
Abscess noted to mid upper abdomen under rt. Breast, area is red , swollen and painful to touch, no drainage noted

## 2012-01-07 ENCOUNTER — Other Ambulatory Visit: Payer: Self-pay | Admitting: Cardiology

## 2012-01-24 ENCOUNTER — Other Ambulatory Visit: Payer: Self-pay | Admitting: Cardiology

## 2012-03-27 ENCOUNTER — Other Ambulatory Visit: Payer: Self-pay | Admitting: Cardiology

## 2012-04-24 ENCOUNTER — Other Ambulatory Visit: Payer: Self-pay | Admitting: Cardiology

## 2012-04-27 ENCOUNTER — Encounter (HOSPITAL_COMMUNITY): Payer: Self-pay | Admitting: Pharmacy Technician

## 2012-05-03 ENCOUNTER — Encounter (HOSPITAL_COMMUNITY): Payer: Self-pay

## 2012-05-11 ENCOUNTER — Encounter (HOSPITAL_COMMUNITY)
Admission: RE | Disposition: A | Discharge status: Home / Self Care | Payer: Self-pay | Source: Ambulatory Visit | Attending: Ophthalmology

## 2012-05-11 ENCOUNTER — Other Ambulatory Visit: Payer: Self-pay | Admitting: Ophthalmology

## 2012-05-11 ENCOUNTER — Encounter: Payer: Self-pay | Admitting: Ophthalmology

## 2012-05-11 ENCOUNTER — Ambulatory Visit (HOSPITAL_COMMUNITY)
Admission: RE | Admit: 2012-05-11 | Discharge: 2012-05-11 | Disposition: A | Discharge status: Home / Self Care | Payer: Medicare Other | Source: Ambulatory Visit | Attending: Ophthalmology | Admitting: Ophthalmology

## 2012-05-11 DIAGNOSIS — H2640 Unspecified secondary cataract: Secondary | ICD-10-CM | POA: Insufficient documentation

## 2012-05-11 HISTORY — PX: CAPSULOTOMY: SHX5412

## 2012-05-11 SURGERY — MINOR CAPSULOTOMY
Anesthesia: LOCAL | Laterality: Left

## 2012-05-11 SURGERY — MINOR CAPSULOTOMY
Anesthesia: Topical | Laterality: Left

## 2012-05-11 MED ORDER — CYCLOPENTOLATE-PHENYLEPHRINE 0.2-1 % OP SOLN: 1.0000 [drp] | Freq: Once | OPHTHALMIC | Status: DC

## 2012-05-11 MED ORDER — CYCLOPENTOLATE-PHENYLEPHRINE 0.2-1 % OP SOLN
OPHTHALMIC | Status: AC
  Administered 2012-05-11: 1 [drp]
  Filled 2012-05-11: qty 2

## 2012-05-11 MED ORDER — APRACLONIDINE HCL 0.5 % OP SOLN: 1.0000 [drp] | Freq: Once | OPHTHALMIC | Status: DC

## 2012-05-11 MED ORDER — APRACLONIDINE HCL 0.5 % OP SOLN
OPHTHALMIC | Status: AC
  Administered 2012-05-11: 1 [drp]
  Filled 2012-05-11: qty 5

## 2012-05-11 MED ORDER — CYCLOPENTOLATE-PHENYLEPHRINE 0.2-1 % OP SOLN: 1.0000 [drp] | Freq: Once | OPHTHALMIC | Status: AC

## 2012-05-11 MED ORDER — CYCLOPENTOLATE-PHENYLEPHRINE 0.2-1 % OP SOLN: 1.0000 [drp] | OPHTHALMIC | Status: DC

## 2012-05-11 NOTE — Brief Op Note (Signed)
05/11/2012  7:22 AM  PATIENT: [name] 78 y.o. female  PRE-OPERATIVE DIAGNOSIS: opaque posterior capsule  POST-OPERATIVE DIAGNOSIS: * Same *  PROCEDURE: Procedure(s) (LRB):  MINOR CAPSULOTOMY (Left)  No. Of pulse 106  Energy / shot 3.6  Pulse/shot 1  Total Energy 31  SURGEON: Surgeon(s) and Role:  * Curstin Schmale E Berel Najjar Jr., MD - Primary  PHYSICIAN ASSISTANT:  ASSISTANTS: none  ANESTHESIA: none  EBL:  BLOOD ADMINISTERED:none  DRAINS: none  LOCAL MEDICATIONS USED: NONE  SPECIMEN: No Specimen  DISPOSITION OF SPECIMEN: N/A  COUNTS: YES  TOURNIQUET: * No tourniquets in log *  DICTATION: .Note written in EPIC  PLAN OF CARE: Discharge to home after PACU  PATIENT DISPOSITION: PACU - hemodynamically stable.  Delay start of Pharmacological VTE agent (>24hrs) due to surgical blood loss or risk of bleeding: not applicable    

## 2012-05-11 NOTE — H&P (Signed)
  78 yo female has had cataract surgery left eye.  Now has an opaque posterior capsule that is causing blurred vision.  Admitted for yag laser capsulotomy os.

## 2012-05-11 NOTE — Progress Notes (Signed)
Patient was given  2 drops cyclomydril and 2  Drops iopodine in left eye total.  Patient tolerated procedure well .

## 2012-05-11 NOTE — H&P (Signed)
  78 yo female has had cataract surgery ou.  Has a cloudy posterior capsule left eye obscuring vision.  Yag laser capsulotomy left eye recommended.  She is admitted for this purpose at this time.

## 2012-05-13 ENCOUNTER — Encounter (HOSPITAL_COMMUNITY): Payer: Self-pay | Admitting: Ophthalmology

## 2012-06-11 NOTE — Op Note (Signed)
05/11/2012  7:22 AM  PATIENT: [name] 78 y.o. female  PRE-OPERATIVE DIAGNOSIS: opaque posterior capsule  POST-OPERATIVE DIAGNOSIS: * Same *  PROCEDURE: Procedure(s) (LRB):  MINOR CAPSULOTOMY (Left)  No. Of pulse 106  Energy / shot 3.6  Pulse/shot 1  Total Energy 31  SURGEON: Surgeon(s) and Role:  Vita Erm., MD - Primary  PHYSICIAN ASSISTANT:  ASSISTANTS: none  ANESTHESIA: none  EBL:  BLOOD ADMINISTERED:none  DRAINS: none  LOCAL MEDICATIONS USED: NONE  SPECIMEN: No Specimen  DISPOSITION OF SPECIMEN: N/A  COUNTS: YES  TOURNIQUET: * No tourniquets in log *  DICTATION: .Note written in EPIC  PLAN OF CARE: Discharge to home after PACU  PATIENT DISPOSITION: PACU - hemodynamically stable.  Delay start of Pharmacological VTE agent (>24hrs) due to surgical blood loss or risk of bleeding: not applicable

## 2012-12-07 ENCOUNTER — Observation Stay (HOSPITAL_COMMUNITY)
Admission: EM | Admit: 2012-12-07 | Discharge: 2012-12-09 | Disposition: A | Discharge status: Home / Self Care | Payer: Medicare Other | Attending: Cardiology | Admitting: Cardiology

## 2012-12-07 ENCOUNTER — Emergency Department (HOSPITAL_COMMUNITY): Payer: Medicare Other

## 2012-12-07 ENCOUNTER — Encounter (HOSPITAL_COMMUNITY): Payer: Self-pay | Admitting: Adult Health

## 2012-12-07 DIAGNOSIS — E7800 Pure hypercholesterolemia, unspecified: Secondary | ICD-10-CM | POA: Insufficient documentation

## 2012-12-07 DIAGNOSIS — I4510 Unspecified right bundle-branch block: Secondary | ICD-10-CM | POA: Insufficient documentation

## 2012-12-07 DIAGNOSIS — T82897A Other specified complication of cardiac prosthetic devices, implants and grafts, initial encounter: Secondary | ICD-10-CM | POA: Insufficient documentation

## 2012-12-07 DIAGNOSIS — I10 Essential (primary) hypertension: Secondary | ICD-10-CM | POA: Insufficient documentation

## 2012-12-07 DIAGNOSIS — D649 Anemia, unspecified: Secondary | ICD-10-CM | POA: Insufficient documentation

## 2012-12-07 DIAGNOSIS — E119 Type 2 diabetes mellitus without complications: Secondary | ICD-10-CM | POA: Insufficient documentation

## 2012-12-07 DIAGNOSIS — Z8673 Personal history of transient ischemic attack (TIA), and cerebral infarction without residual deficits: Secondary | ICD-10-CM | POA: Insufficient documentation

## 2012-12-07 DIAGNOSIS — Y831 Surgical operation with implant of artificial internal device as the cause of abnormal reaction of the patient, or of later complication, without mention of misadventure at the time of the procedure: Secondary | ICD-10-CM | POA: Insufficient documentation

## 2012-12-07 DIAGNOSIS — I2582 Chronic total occlusion of coronary artery: Secondary | ICD-10-CM | POA: Insufficient documentation

## 2012-12-07 DIAGNOSIS — I25810 Atherosclerosis of coronary artery bypass graft(s) without angina pectoris: Secondary | ICD-10-CM | POA: Insufficient documentation

## 2012-12-07 DIAGNOSIS — R079 Chest pain, unspecified: Secondary | ICD-10-CM | POA: Insufficient documentation

## 2012-12-07 DIAGNOSIS — I200 Unstable angina: Secondary | ICD-10-CM | POA: Insufficient documentation

## 2012-12-07 DIAGNOSIS — M1990 Unspecified osteoarthritis, unspecified site: Secondary | ICD-10-CM | POA: Insufficient documentation

## 2012-12-07 DIAGNOSIS — I2510 Atherosclerotic heart disease of native coronary artery without angina pectoris: Principal | ICD-10-CM | POA: Insufficient documentation

## 2012-12-07 HISTORY — DX: Type 2 diabetes mellitus without complications: E11.9

## 2012-12-07 HISTORY — DX: Cerebral infarction, unspecified: I63.9

## 2012-12-07 LAB — CBC WITH DIFFERENTIAL/PLATELET
Basophils Absolute: 0 10*3/uL (ref 0.0–0.1)
Eosinophils Relative: 4 % (ref 0–5)
Lymphocytes Relative: 37 % (ref 12–46)
MCV: 66.8 fL — ABNORMAL LOW (ref 78.0–100.0)
Monocytes Relative: 6 % (ref 3–12)
Neutrophils Relative %: 53 % (ref 43–77)
Platelets: 164 10*3/uL (ref 150–400)
RBC: 5.18 MIL/uL — ABNORMAL HIGH (ref 3.87–5.11)
RDW: 14.9 % (ref 11.5–15.5)
WBC: 7.1 10*3/uL (ref 4.0–10.5)

## 2012-12-07 LAB — POCT I-STAT TROPONIN I: Troponin i, poc: 0.01 ng/mL (ref 0.00–0.08)

## 2012-12-07 LAB — COMPREHENSIVE METABOLIC PANEL
ALT: 10 U/L (ref 0–35)
AST: 22 U/L (ref 0–37)
Albumin: 3.7 g/dL (ref 3.5–5.2)
Alkaline Phosphatase: 59 U/L (ref 39–117)
BUN: 12 mg/dL (ref 6–23)
Chloride: 106 mEq/L (ref 96–112)
Potassium: 4.1 mEq/L (ref 3.5–5.1)
Sodium: 140 mEq/L (ref 135–145)
Total Bilirubin: 0.2 mg/dL — ABNORMAL LOW (ref 0.3–1.2)
Total Protein: 8.2 g/dL (ref 6.0–8.3)

## 2012-12-07 LAB — PROTIME-INR: Prothrombin Time: 13.7 seconds (ref 11.6–15.2)

## 2012-12-07 LAB — BASIC METABOLIC PANEL
CO2: 21 mEq/L (ref 19–32)
Calcium: 9.5 mg/dL (ref 8.4–10.5)
Sodium: 139 mEq/L (ref 135–145)

## 2012-12-07 LAB — CBC
MCH: 21.2 pg — ABNORMAL LOW (ref 26.0–34.0)
Platelets: 172 10*3/uL (ref 150–400)
RBC: 5.01 MIL/uL (ref 3.87–5.11)
WBC: 6.8 10*3/uL (ref 4.0–10.5)

## 2012-12-07 LAB — APTT: aPTT: 32 seconds (ref 24–37)

## 2012-12-07 LAB — MAGNESIUM: Magnesium: 1.9 mg/dL (ref 1.5–2.5)

## 2012-12-07 MED ORDER — PANTOPRAZOLE SODIUM 40 MG PO TBEC
40.0000 mg | DELAYED_RELEASE_TABLET | Freq: Every day | ORAL | Status: DC
  Administered 2012-12-07 – 2012-12-09 (×2): 40 mg via ORAL
  Filled 2012-12-07 (×2): qty 1

## 2012-12-07 MED ORDER — NITROGLYCERIN 0.4 MG SL SUBL: 0.4000 mg | SUBLINGUAL_TABLET | SUBLINGUAL | Status: DC | PRN

## 2012-12-07 MED ORDER — HEPARIN SODIUM (PORCINE) 5000 UNIT/ML IJ SOLN
5000.0000 [IU] | Freq: Three times a day (TID) | INTRAMUSCULAR | Status: DC
  Administered 2012-12-07 – 2012-12-09 (×5): 5000 [IU] via SUBCUTANEOUS
  Filled 2012-12-07 (×8): qty 1

## 2012-12-07 MED ORDER — ASPIRIN 81 MG PO CHEW: 324.0000 mg | CHEWABLE_TABLET | ORAL | Status: AC

## 2012-12-07 MED ORDER — METOPROLOL TARTRATE 25 MG PO TABS
25.0000 mg | ORAL_TABLET | Freq: Two times a day (BID) | ORAL | Status: DC
  Administered 2012-12-07 – 2012-12-08 (×3): 25 mg via ORAL
  Filled 2012-12-07 (×5): qty 1

## 2012-12-07 MED ORDER — ASPIRIN EC 81 MG PO TBEC: 81.0000 mg | DELAYED_RELEASE_TABLET | Freq: Every day | ORAL | Status: DC | PRN

## 2012-12-07 MED ORDER — ONDANSETRON HCL 4 MG/2ML IJ SOLN: 4.0000 mg | Freq: Four times a day (QID) | INTRAMUSCULAR | Status: DC | PRN

## 2012-12-07 MED ORDER — ATORVASTATIN CALCIUM 40 MG PO TABS
40.0000 mg | ORAL_TABLET | Freq: Every day | ORAL | Status: DC
  Administered 2012-12-08 – 2012-12-09 (×2): 40 mg via ORAL
  Filled 2012-12-07 (×2): qty 1

## 2012-12-07 MED ORDER — NITROGLYCERIN IN D5W 200-5 MCG/ML-% IV SOLN
5.0000 ug/min | INTRAVENOUS | Status: DC
  Administered 2012-12-07: 5 ug/min via INTRAVENOUS

## 2012-12-07 MED ORDER — ASPIRIN EC 81 MG PO TBEC
81.0000 mg | DELAYED_RELEASE_TABLET | Freq: Every day | ORAL | Status: DC
  Administered 2012-12-08 – 2012-12-09 (×2): 81 mg via ORAL
  Filled 2012-12-07 (×2): qty 1

## 2012-12-07 MED ORDER — NITROGLYCERIN IN D5W 200-5 MCG/ML-% IV SOLN
5.0000 ug/min | INTRAVENOUS | Status: DC
  Administered 2012-12-07: 5 ug/min via INTRAVENOUS
  Filled 2012-12-07: qty 250

## 2012-12-07 MED ORDER — AMLODIPINE BESYLATE 2.5 MG PO TABS
2.5000 mg | ORAL_TABLET | Freq: Every day | ORAL | Status: DC
  Administered 2012-12-08 – 2012-12-09 (×2): 2.5 mg via ORAL
  Filled 2012-12-07 (×2): qty 1

## 2012-12-07 MED ORDER — RAMIPRIL 10 MG PO CAPS
10.0000 mg | ORAL_CAPSULE | Freq: Every day | ORAL | Status: DC
  Administered 2012-12-08 – 2012-12-09 (×2): 10 mg via ORAL
  Filled 2012-12-07 (×2): qty 1

## 2012-12-07 MED ORDER — SODIUM CHLORIDE 0.9 % IV SOLN
INTRAVENOUS | Status: DC
  Administered 2012-12-07: 20:00:00 via INTRAVENOUS

## 2012-12-07 MED ORDER — ASPIRIN 300 MG RE SUPP: 300.0000 mg | RECTAL | Status: AC

## 2012-12-07 MED ORDER — INSULIN ASPART 100 UNIT/ML ~~LOC~~ SOLN
0.0000 [IU] | Freq: Three times a day (TID) | SUBCUTANEOUS | Status: DC
  Administered 2012-12-08: 2 [IU] via SUBCUTANEOUS
  Administered 2012-12-09 (×2): 1 [IU] via SUBCUTANEOUS

## 2012-12-07 MED ORDER — ACETAMINOPHEN 325 MG PO TABS: 650.0000 mg | ORAL_TABLET | ORAL | Status: DC | PRN

## 2012-12-07 MED ORDER — METFORMIN HCL 500 MG PO TABS
500.0000 mg | ORAL_TABLET | Freq: Two times a day (BID) | ORAL | Status: DC
  Administered 2012-12-07: 500 mg via ORAL
  Filled 2012-12-07 (×3): qty 2
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
## 2012-12-07 NOTE — Plan of Care (Signed)
Problem: Phase I Progression Outcomes Goal: Aspirin unless contraindicated Outcome: Completed/Met Date Met:  12/07/12 Given 324 prior to admission

## 2012-12-07 NOTE — ED Notes (Signed)
Diet tray ordered for pt. Spoke with Clydie Braun from service response.

## 2012-12-07 NOTE — ED Notes (Signed)
Presents with intermittent chest pressure that began 5 hours ago, rated a 9/10 when happening and 0 when goes away, pt is diaphoretic and describes the pain as under left breast radiation to left shoulder assoicated with SOB and pressure. Denies nausea and dizziness  Took 1 nitro with relief of pain and 324 of ASA.

## 2012-12-07 NOTE — ED Notes (Signed)
Daughter [name] given update on mother's condition. Pt verbalized permission for this RN to give daughter an update.

## 2012-12-07 NOTE — ED Notes (Signed)
Spoke with Dr. Sharyn Lull regarding nitro drip.  Stated that he wanted the nitro drip to run continuously at 91mcg/hr no titration.  Stated he would change his order to reflect this.

## 2012-12-08 ENCOUNTER — Observation Stay (HOSPITAL_COMMUNITY): Payer: Medicare Other

## 2012-12-08 LAB — LIPID PANEL
Cholesterol: 64 mg/dL (ref 0–200)
HDL: 17 mg/dL — ABNORMAL LOW (ref >39)
Total CHOL/HDL Ratio: 3.8 RATIO

## 2012-12-08 LAB — TSH: TSH: 2.44 u[IU]/mL (ref 0.350–4.500)

## 2012-12-08 LAB — BASIC METABOLIC PANEL
BUN: 9 mg/dL (ref 6–23)
Creatinine, Ser: 1.19 mg/dL — ABNORMAL HIGH (ref 0.50–1.10)
GFR calc non Af Amer: 42 mL/min — ABNORMAL LOW (ref >90)
Glucose, Bld: 99 mg/dL (ref 70–99)
Potassium: 4 mEq/L (ref 3.5–5.1)

## 2012-12-08 LAB — CBC
HCT: 32.6 % — ABNORMAL LOW (ref 36.0–46.0)
Hemoglobin: 10.5 g/dL — ABNORMAL LOW (ref 12.0–15.0)
MCHC: 32.2 g/dL (ref 30.0–36.0)
MCV: 65.6 fL — ABNORMAL LOW (ref 78.0–100.0)

## 2012-12-08 LAB — GLUCOSE, CAPILLARY
Glucose-Capillary: 162 mg/dL — ABNORMAL HIGH (ref 70–99)
Glucose-Capillary: 152 mg/dL — ABNORMAL HIGH (ref 70–99)

## 2012-12-08 LAB — TROPONIN I: Troponin I: <0.3 ng/mL (ref <0.30)

## 2012-12-08 MED ORDER — NITROGLYCERIN 2 % TD OINT
0.5000 [in_us] | TOPICAL_OINTMENT | Freq: Four times a day (QID) | TRANSDERMAL | Status: DC
  Administered 2012-12-08 – 2012-12-09 (×3): 0.5 [in_us] via TOPICAL
  Filled 2012-12-08: qty 30

## 2012-12-08 MED ORDER — SODIUM CHLORIDE 0.9 % IV SOLN
1.0000 mL/kg/h | INTRAVENOUS | Status: DC
  Administered 2012-12-08: 1 mL/kg/h via INTRAVENOUS

## 2012-12-08 MED ORDER — TECHNETIUM TC 99M SESTAMIBI GENERIC - CARDIOLITE
10.0000 | Freq: Once | INTRAVENOUS | Status: AC | PRN
  Administered 2012-12-08: 10 via INTRAVENOUS

## 2012-12-08 MED ORDER — METFORMIN HCL 500 MG PO TABS
1000.0000 mg | ORAL_TABLET | Freq: Every day | ORAL | Status: DC
  Filled 2012-12-08: qty 2

## 2012-12-08 MED ORDER — REGADENOSON 0.4 MG/5ML IV SOLN
0.4000 mg | Freq: Once | INTRAVENOUS | Status: AC
  Administered 2012-12-08: 0.4 mg via INTRAVENOUS

## 2012-12-08 MED ORDER — METFORMIN HCL 500 MG PO TABS
500.0000 mg | ORAL_TABLET | Freq: Every day | ORAL | Status: DC
Start: 2012-12-08 — End: 2012-12-08
  Administered 2012-12-08: 500 mg via ORAL
  Filled 2012-12-08: qty 1

## 2012-12-08 MED ORDER — SODIUM CHLORIDE 0.9 % IJ SOLN: 3.0000 mL | Freq: Two times a day (BID) | INTRAMUSCULAR | Status: DC

## 2012-12-08 MED ORDER — SODIUM CHLORIDE 0.9 % IV SOLN: 250.0000 mL | INTRAVENOUS | Status: DC | PRN

## 2012-12-08 MED ORDER — SODIUM CHLORIDE 0.9 % IJ SOLN: 3.0000 mL | INTRAMUSCULAR | Status: DC | PRN

## 2012-12-08 MED ORDER — TECHNETIUM TC 99M SESTAMIBI GENERIC - CARDIOLITE
30.0000 | Freq: Once | INTRAVENOUS | Status: AC | PRN
  Administered 2012-12-08: 30 via INTRAVENOUS
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
## 2012-12-08 NOTE — Progress Notes (Signed)
Verbal order per pt to give information to her daughter about her healthcare. Sanda Linger, RN

## 2012-12-08 NOTE — Progress Notes (Signed)
Verbal order from Dr. Sharyn Lull to change Nitro order for chest pain, titrate as needed. Will continue to monitor the pt. Sanda Linger

## 2012-12-09 ENCOUNTER — Encounter (HOSPITAL_COMMUNITY)
Admission: EM | Disposition: A | Discharge status: Home / Self Care | Payer: Self-pay | Source: Home / Self Care | Attending: Emergency Medicine

## 2012-12-09 HISTORY — PX: LEFT HEART CATHETERIZATION WITH CORONARY/GRAFT ANGIOGRAM: SHX5450

## 2012-12-09 LAB — PROTIME-INR
INR: 1.08 (ref 0.00–1.49)
Prothrombin Time: 13.9 seconds (ref 11.6–15.2)

## 2012-12-09 SURGERY — LEFT HEART CATHETERIZATION WITH CORONARY/GRAFT ANGIOGRAM
Anesthesia: LOCAL

## 2012-12-09 MED ORDER — HEPARIN (PORCINE) IN NACL 2-0.9 UNIT/ML-% IJ SOLN
INTRAMUSCULAR | Status: AC
  Filled 2012-12-09: qty 1000

## 2012-12-09 MED ORDER — NITROGLYCERIN 0.4 MG SL SUBL: 0.4000 mg | SUBLINGUAL_TABLET | SUBLINGUAL | Status: DC | PRN

## 2012-12-09 MED ORDER — ASPIRIN 81 MG PO CHEW: 324.0000 mg | CHEWABLE_TABLET | ORAL | Status: DC

## 2012-12-09 MED ORDER — AMLODIPINE BESYLATE 5 MG PO TABS: 5.0000 mg | ORAL_TABLET | Freq: Every day | ORAL | Status: DC

## 2012-12-09 MED ORDER — METOPROLOL SUCCINATE ER 50 MG PO TB24: 50.0000 mg | ORAL_TABLET | Freq: Every day | ORAL | Status: DC

## 2012-12-09 MED ORDER — NITROGLYCERIN 0.4 MG/HR TD PT24
0.4000 mg | MEDICATED_PATCH | Freq: Every day | TRANSDERMAL | Status: DC
  Administered 2012-12-09: 0.4 mg via TRANSDERMAL
  Filled 2012-12-09: qty 1

## 2012-12-09 MED ORDER — ACETAMINOPHEN 325 MG PO TABS: 650.0000 mg | ORAL_TABLET | ORAL | Status: DC | PRN

## 2012-12-09 MED ORDER — FENTANYL CITRATE 0.05 MG/ML IJ SOLN
INTRAMUSCULAR | Status: AC
  Filled 2012-12-09: qty 2

## 2012-12-09 MED ORDER — LIDOCAINE HCL (PF) 1 % IJ SOLN
INTRAMUSCULAR | Status: AC
  Filled 2012-12-09: qty 30

## 2012-12-09 MED ORDER — METFORMIN HCL 500 MG PO TABS: 500.0000 mg | ORAL_TABLET | Freq: Two times a day (BID) | ORAL | Status: DC

## 2012-12-09 MED ORDER — MIDAZOLAM HCL 5 MG/5ML IJ SOLN
INTRAMUSCULAR | Status: AC
  Filled 2012-12-09: qty 5

## 2012-12-09 MED ORDER — ONDANSETRON HCL 4 MG/2ML IJ SOLN: 4.0000 mg | Freq: Four times a day (QID) | INTRAMUSCULAR | Status: DC | PRN

## 2012-12-09 MED ORDER — DIAZEPAM 5 MG PO TABS: 5.0000 mg | ORAL_TABLET | ORAL | Status: DC

## 2012-12-09 MED ORDER — SODIUM CHLORIDE 0.9 % IV SOLN: INTRAVENOUS | Status: AC

## 2012-12-09 MED ORDER — PANTOPRAZOLE SODIUM 40 MG PO TBEC: 40.0000 mg | DELAYED_RELEASE_TABLET | Freq: Every day | ORAL | Status: DC

## 2012-12-09 NOTE — Consult Note (Signed)
No changes in H&P

## 2012-12-09 NOTE — Progress Notes (Signed)
Unable to get second IV site for cath. IV team attempted twice pt too scared & jumped every time an iv was attempted. Sanda Linger

## 2012-12-09 NOTE — Discharge Summary (Signed)
  Discharge summary dictated on 12/09/2012 dictation number is [phone]

## 2012-12-09 NOTE — Cardiovascular Report (Signed)
NAMEAURA, BIBBY NO.:  [id number]  MEDICAL RECORD NO.:  [id number]  LOCATION:  3W08C                        FACILITY:  [facility]  PHYSICIAN:  Mirely Pangle N. Sharyn Lull, M.D. DATE OF BIRTH:  10/30/1930  DATE OF PROCEDURE:  12/09/2012 DATE OF DISCHARGE:                           CARDIAC CATHETERIZATION   PROCEDURE:  Left cardiac cath with selective left and right coronary angiography, LV graphy, visualization of free radial artery graft to RCA and LIMA to LAD via right groin using Judkins technique.  INDICATION FOR THE PROCEDURE:  Ms. Streed is a 79 year old female with past medical history significant for coronary artery disease status post CABG x2 in 1981 and subsequently had redo CABG in 2001, hypertension, diabetes mellitus, hypercholesteremia, history of TIA, chronic anemia. She came to the ER complaining of recurrent retrosternal and left-sided chest pain off and on since this morning.  States chest pain was grade 9/10, sharp, radiating to left shoulder associated with diaphoresis. Denies any nausea, vomiting.  Denies any shortness of breath.  She took aspirin with partial relief.  Did not have any sublingual nitro, so decided to come to ED.  The patient received sublingual nitro with relief of chest pain in the ER.  The patient denies any palpitation, lightheadedness, or syncope.  Denies PND, orthopnea, or leg swelling. Denies any exertional chest pain, although her activity is very limited. Denies any weakness in the arms or legs.  Denies any slurred speech. The patient was admitted to telemetry unit.  MI was ruled out by serial enzymes and EKG.  The patient subsequently underwent Lexiscan Myoview which showed small region of inducible ischemia at the junction of apical anterior and apical lateral segments, scar in the inferior wall at the cardiac base, EF of 50%.  Due to typical anginal chest pain, multiple risk factors, and abnormal nuclear stress test,  discussed with the patient regarding left cath, possible PTCA stenting, its risks and benefits, i.e., death, MI, stroke, need for emergency CABG, local vascular complications, risk of restenosis, distal embolization, etc., and consented for PCI.  PROCEDURE:  After obtaining the informed consent, the patient was brought to the cath lab and was placed on fluoroscopy table.  Right groin was prepped and draped in usual fashion.  Xylocaine 1% was used for local anesthesia in the right groin.  With the help of thin wall needle, 5-French arterial sheath was placed.  The sheath was aspirated and flushed.  A 5-French left Judkins catheter was advanced over the wire under fluoroscopic guidance up to the ascending aorta.  Wire was pulled out. The catheter was aspirated and connected to the Manifold.  Catheter was further advanced and engaged into left coronary ostium.  Multiple views of the left system were taken.  Next, catheter was disengaged and was pulled out over the wire and was replaced with 5-French right Judkins catheter, which was advanced over the wire under fluoroscopic guidance up to the ascending aorta.  Wire was pulled out.  The catheter was aspirated and connected to the Manifold.  Catheter was further advanced and engaged into right coronary ostium.  A single view of right coronary artery was obtained.  Next, catheter was disengaged and was engaged into  free radial artery graft to diagonal 2.  Multiple views of this graft were taken.  Next, catheter was disengaged and was engaged into to LIMA to LAD.  Multiple views of this graft were taken.  Next, catheter was disengaged and was pulled out over the wire and was exchanged to pigtail catheter, which was advanced over the wire under fluoroscopic guidance up to the ascending aorta.  Catheter was further advanced across the aortic valve into the LV.  LV pressures were recorded.  Next LV graft was done in 30-degree RAO position.  Post  angiographic pressures were recorded from LV and then pullback pressures were recorded from aorta. There was no significant gradient across the aortic valve.  Next the pigtail catheter was pulled out over the wire.  Sheaths were aspirated and flushed.  FINDINGS:  LV showed good LV systolic function, LVH, EF of 40-98%. There was 3+ MR. Left main has 60-70% distal stenosis.  LAD has 80-85% ostial stenosis, which is more apparent in LAO caudal view and 30% proximal stenosis followed by 99% mid focal stenosis, followed by 70-80% diffuse mid stenosis and then 100% occluded filling by LIMA.  Diagonal 1 and 2 were 100% occluded as before.  Left circumflex has 60-70% ostial and 30-40% proximal diffuse disease and 90-99% diffuse in-stent restenosis, as before distally vessel is very small.  RCA has 70-85% diffuse proximal stenosis and then 100% occluded as before.  LIMA to LAD is patent.  Free radial artery graft to diagonal 2 is patent which is providing collaterals to distal RCA.  The other 2 grafts, graft to RCA and saphenous vein graft to diagonal 1 were occluded at the ostium as per prior angiogram.  The patient tolerated procedure well.  There were no complications.  Plan is to maximize antianginal medications.     Eduardo Osier. Sharyn Lull, M.D.     MNH/MEDQ  D:  12/09/2012  T:  12/09/2012  Job:  [id number]

## 2012-12-09 NOTE — CV Procedure (Signed)
Lethargic cath report dictated on 12/09/2012 dictation number raise [phone]

## 2012-12-09 NOTE — Progress Notes (Addendum)
Patient heart rate=58-59.  I notified Dr. Sharyn Lull he stated to hold AM dose of po metoprolol.

## 2012-12-10 NOTE — Discharge Summary (Signed)
[name], [name]:  [id number]  MEDICAL RECORD NO.:  [id number]  LOCATION:  3W08C                        FACILITY:  [facility]  PHYSICIAN:  Tyrease Vandeberg N. Sharyn Lull, M.D. DATE OF BIRTH:  [birth year]  DATE OF ADMISSION:  12/07/2012 DATE OF DISCHARGE:  12/09/2012                              DISCHARGE SUMMARY   ADMITTING DIAGNOSES: 1. Unstable angina, rule out myocardial infarction, coronary artery     disease, history of coronary artery bypass graft x2 in the past. 2. Hypertension. 3. Diabetes mellitus. 4. History of transient ischemic attack. 5. Hypercholesteremia. 6. Chronic anemia. 7. Degenerative joint disease.  FINAL DIAGNOSES: 1. Status post unstable angina, myocardial infarction ruled out,     mildly positive Lexiscan Myoview status post left cardiac cath. 2. Coronary artery disease, history of coronary artery bypass graft x2     in the past in 1981 and 2001. 3. Hypertension. 4. Diabetes mellitus. 5. History of transient ischemic attack. 6. Hypercholesteremia. 7. Chronic anemia. 8. Degenerative joint disease.  DISCHARGE HOME MEDICATIONS: 1. Metoprolol succinate 50 mg 1 tablet daily. 2. Pantoprazole 40 mg 1 tablet daily. 3. Amlodipine has been increased to 5 mg daily.  Continue with rest of the medications i.e., 1. Aspirin 81 mg 1 tablet daily. 2. Glipizide 10 mg 1 tablet daily. 3. Imdur 60 mg daily. 4. Metformin start from December 11, 2012, 500 mg 2 tablets with lunch     and 1 tablet with dinner as before. 5. Nitrostat 0.4 mg sublingual use as directed. 6. Ramipril 10 mg 1 capsule daily. 7. Crestor 10 mg 1 tablet daily.  DIET:  Low-salt, low-cholesterol, 1800 calories ADA diet.  Post cardiac cath instructions have been given.  Follow up with me in 1 week.  CONDITION AT DISCHARGE:  Stable.  BRIEF HISTORY AND HOSPITAL COURSE:  [name] is an 79 year old female with past medical history significant for coronary artery disease status post CABG  x2 in 1981 and subsequently had redo CABG in 2001, hypertension, diabetes mellitus, hypercholesteremia, history of TIA, chronic anemia.  She came to the ER complaining of recurrent retrosternal and left-sided chest pain off and on since this morning. States chest pain was grade 9/10 sharp, radiating to left shoulder associated with diaphoresis.  Denies any nausea or vomiting.  Denies any shortness of breath.  She took aspirin with partial relief, did not have any sublingual nitro, so decided to come to the ED.  The patient received sublingual nitro in the ER with relief of chest pain.  The patient denies any palpitation, lightheadedness, or syncope.  Denies PND, orthopnea, or leg swelling.  Denies any exertional chest pain, although her activity is limited.  Denies any weakness in the arms or legs or slurred speech.  PHYSICAL EXAMINATION:  GENERAL:  She was alert, awake, oriented x3. VITAL SIGNS:  Blood pressure was 155/78, pulse was 64.  She was afebrile. EYES:  Conjunctivae was pink. NECK:  Supple.  No JVD.  No bruit. LUNGS:  Clear to auscultation without rhonchi or rales. CARDIOVASCULAR:  S1, S2 was normal.  There was soft systolic murmur and S4 gallop. ABDOMEN:  Soft.  Bowel sounds were present.  Nontender. EXTREMITIES:  There is no  clubbing, cyanosis, or edema.  LABS:  Sodium was 139, potassium 3.7, BUN 12, creatinine 1.28, glucose was 131.  Three sets of cardiac enzymes were negative.  Hemoglobin was 10.6, hematocrit 33.8, white count of 6.8.  EKG showed normal sinus rhythm with right bundle-branch block.  BRIEF HOSPITAL COURSE:  The patient was admitted to telemetry unit.  MI was ruled out by serial enzymes and EKG.  The patient subsequently underwent nuclear Lexiscan Myoview which showed small region of reversible ischemia at the junction of apical anterior and apical lateral segments with EF of 50%.  Subsequently, the patient underwent left cardiac cath with selective left  and right coronary angiography. Visualization of saphenous vein grafts and free radial artery graft, vein graft, and LIMA graft via right groin as per procedure report.  The patient tolerated the procedure well.  Post procedure, the patient did not have any episodes of anginal chest pain.  Her groin is stable with no evidence of hematoma or bruit.  The patient has been ambulating in the room without any problems.  The patient's antianginal medications have been increased as per orders.  The patient will be discharged home on above medications and will be followed up in my office in 1 week.     Eduardo Osier. Sharyn Lull, M.D.     MNH/MEDQ  D:  12/09/2012  T:  12/10/2012  Job:  [id number]

## 2013-06-21 ENCOUNTER — Other Ambulatory Visit: Payer: Self-pay | Admitting: Gastroenterology

## 2013-07-11 ENCOUNTER — Encounter (HOSPITAL_COMMUNITY): Payer: Self-pay | Admitting: *Deleted

## 2013-07-11 ENCOUNTER — Ambulatory Visit (HOSPITAL_COMMUNITY)
Admission: RE | Admit: 2013-07-11 | Discharge: 2013-07-11 | Disposition: A | Discharge status: Home / Self Care | Payer: Medicare Other | Source: Ambulatory Visit | Attending: Gastroenterology | Admitting: Gastroenterology

## 2013-07-11 ENCOUNTER — Encounter (HOSPITAL_COMMUNITY)
Admission: RE | Disposition: A | Discharge status: Home / Self Care | Payer: Self-pay | Source: Ambulatory Visit | Attending: Gastroenterology

## 2013-07-11 DIAGNOSIS — D126 Benign neoplasm of colon, unspecified: Secondary | ICD-10-CM | POA: Insufficient documentation

## 2013-07-11 DIAGNOSIS — K625 Hemorrhage of anus and rectum: Secondary | ICD-10-CM | POA: Insufficient documentation

## 2013-07-11 DIAGNOSIS — K5900 Constipation, unspecified: Secondary | ICD-10-CM | POA: Insufficient documentation

## 2013-07-11 DIAGNOSIS — Z8673 Personal history of transient ischemic attack (TIA), and cerebral infarction without residual deficits: Secondary | ICD-10-CM | POA: Insufficient documentation

## 2013-07-11 DIAGNOSIS — E119 Type 2 diabetes mellitus without complications: Secondary | ICD-10-CM | POA: Insufficient documentation

## 2013-07-11 DIAGNOSIS — K2940 Chronic atrophic gastritis without bleeding: Secondary | ICD-10-CM | POA: Insufficient documentation

## 2013-07-11 DIAGNOSIS — D1779 Benign lipomatous neoplasm of other sites: Secondary | ICD-10-CM | POA: Insufficient documentation

## 2013-07-11 DIAGNOSIS — K5730 Diverticulosis of large intestine without perforation or abscess without bleeding: Secondary | ICD-10-CM | POA: Insufficient documentation

## 2013-07-11 DIAGNOSIS — D509 Iron deficiency anemia, unspecified: Secondary | ICD-10-CM | POA: Insufficient documentation

## 2013-07-11 DIAGNOSIS — Z951 Presence of aortocoronary bypass graft: Secondary | ICD-10-CM | POA: Insufficient documentation

## 2013-07-11 DIAGNOSIS — D131 Benign neoplasm of stomach: Secondary | ICD-10-CM | POA: Insufficient documentation

## 2013-07-11 DIAGNOSIS — Q438 Other specified congenital malformations of intestine: Secondary | ICD-10-CM | POA: Insufficient documentation

## 2013-07-11 DIAGNOSIS — Z7982 Long term (current) use of aspirin: Secondary | ICD-10-CM | POA: Insufficient documentation

## 2013-07-11 DIAGNOSIS — K219 Gastro-esophageal reflux disease without esophagitis: Secondary | ICD-10-CM | POA: Insufficient documentation

## 2013-07-11 DIAGNOSIS — K319 Disease of stomach and duodenum, unspecified: Secondary | ICD-10-CM | POA: Insufficient documentation

## 2013-07-11 HISTORY — PX: COLONOSCOPY: SHX5424

## 2013-07-11 HISTORY — PX: ESOPHAGOGASTRODUODENOSCOPY: SHX5428

## 2013-07-11 SURGERY — EGD (ESOPHAGOGASTRODUODENOSCOPY)
Anesthesia: Moderate Sedation

## 2013-07-11 MED ORDER — MIDAZOLAM HCL 10 MG/2ML IJ SOLN
INTRAMUSCULAR | Status: DC | PRN
  Administered 2013-07-11: 2.5 mg via INTRAVENOUS

## 2013-07-11 MED ORDER — LIDOCAINE VISCOUS 2 % MT SOLN
OROMUCOSAL | Status: DC | PRN
  Administered 2013-07-11: 10 mL via OROMUCOSAL

## 2013-07-11 MED ORDER — DIPHENHYDRAMINE HCL 50 MG/ML IJ SOLN
INTRAMUSCULAR | Status: AC
  Filled 2013-07-11: qty 1

## 2013-07-11 MED ORDER — FENTANYL CITRATE 0.05 MG/ML IJ SOLN
INTRAMUSCULAR | Status: AC
  Filled 2013-07-11: qty 2

## 2013-07-11 MED ORDER — LIDOCAINE VISCOUS 2 % MT SOLN
OROMUCOSAL | Status: AC
  Filled 2013-07-11: qty 15

## 2013-07-11 MED ORDER — FENTANYL CITRATE 0.05 MG/ML IJ SOLN
INTRAMUSCULAR | Status: DC | PRN
  Administered 2013-07-11: 25 ug via INTRAVENOUS

## 2013-07-11 MED ORDER — SODIUM CHLORIDE 0.9 % IV SOLN
INTRAVENOUS | Status: DC
  Administered 2013-07-11: 500 mL via INTRAVENOUS

## 2013-07-11 MED ORDER — MIDAZOLAM HCL 10 MG/2ML IJ SOLN
INTRAMUSCULAR | Status: AC
  Filled 2013-07-11: qty 2

## 2013-07-11 NOTE — Op Note (Signed)
[facility] [address] [place], [address]   OPERATIVE PROCEDURE REPORT  PATIENT: [name], [name]  MR#: [id number] BIRTHDATE: Feb 27, 1931 GENDER: Female ENDOSCOPIST: Dr.  Lorenza Burton, MD ASSISTANT:   Kandice Robinsons, technician Claudie Revering, RN CGRN PROCEDURE DATE: 07/11/2013 PRE-PROCEDURE PREPARATION: The patient was prepped with and a gallon of Golytely the night prior to the procedure.  The patient was fasted for 4 hours prior to the procedure.  PRE-PROCEDURE PHYSICAL: Patient has stable vital signs. Neck is supple. There is no JVD, thyromegaly or LAD. Chest clear to auscultation.  S1 and S2 regular.  Abdomen soft, non-distended, non-tender with NABS. PROCEDURE:     Colonoscopy with cold biopsies x 2. ASA CLASS:     Class III INDICATIONS:     1.  Iron deficiency anemia 2. Chronic constipation 3. Colorectal cancer screening. MEDICATIONS:     Fentanyl 50 mcg  and Versed 1 mg IV.  DESCRIPTION OF PROCEDURE: After the risks, benefits, and alternatives of the procedure were thoroughly explained [including a 10% missed rate of cancer and polyps], informed consent was obtained. The Pentax Colonoscope [id number]  was introduced through the anus  and advanced to the ascending colon , limited by No adverse events experienced.   The quality of the prep was fair at best after multiple washes.. Multiple washes were done. Small lesions could be missed. The instrument was then slowly withdrawn as the colon was fully examined.     COLON FINDINGS: A small dimunitive polyp was removed by cold biopsies x 2 from the proximal right colon.  Few scattered diverticula were also noted. The rest of the colonic mucosa appeared healthy with a normal vascular pattern.  No masses or AVMs were noted.  The appendiceal orifice was not visualized due to a lot of residual debris in the colon and very redundant colon. Multiple lipomas were noted in the right colon recognized by  their yellowish submucosal appearance. Retroflexed views revealed no abnormalities.  The patient tolerated the procedure without immediate complications.  The scope was then withdrawn from the patient and the procedure terminated.  TIME TO CECUM:    Not applicable. WITHDRAW TIME:  20 minutes 00 seconds  IMPRESSION:     1.  One small sessile polyp in the right colon-removed by cold biopsies x 2. 2. Few scattered diverticula. 3. Multiple lipomas in the right colon. 4. Cecal base not visualized. 5. Very redundant colon with a large amount of residual stool in the colon.   RECOMMENDATIONS:     1.  Await pathology results 2.  Hold aspirin, aspirin products, and anti-inflammatory medication for 2 weeks. 3.  Continue surveillance 4.  High fiber diet with liberal fluid intake. 5.  OP follow-up is advised in 2 weeks.   REPEAT EXAM:      for No recall due to age..  If the patient has any abnormal GI symptoms in the interim, she/he have been advised to contact the office as soon as possible for further recommendations.    CPT CODES:     [id number], Colonoscopy with Biopsy   DIAGNOSIS CODES:     280.9 Iron Deficiency Anemia   REFERRED QI:ONGEX Sharyn Lull, M.D.  eSigned:  Dr. Lorenza Burton, MD 07/11/2013 5:40 PM   PATIENT NAME:  [name], [name] MR#: [id number]

## 2013-07-11 NOTE — Op Note (Signed)
[facility] [address] [place], [address]   OPERATIVE PROCEDURE REPORT  PATIENT :[name], [name]  MR#: [id number] BIRTHDATE :19-Feb-1931 GENDER: Female ENDOSCOPIST: Dr.  Lorenza Burton, MD ASSISTANT:   Kandice Robinsons, technician Claudie Revering, RN CGRN PROCEDURE DATE: 08-09-2013 PRE-PROCEDURE PREPERATION: Patient fasted for 4 hours prior to procedure. PRE-PROCEDURE PHYSICAL: Patient has stable vital signs.  Neck is supple.  There is no JVD, thyromegaly or LAD.  Chest clear to auscultation.  S1 and S2 regular.  Abdomen soft, non-distended, non-tender with NABS. PROCEDURE:     EGD with cold biopsies x 4. ASA CLASS:     Class III INDICATIONS:     Unexplained iron deficiency anemia and epigastric pain.[name] MEDICATIONS:     Fentanyl 50 mcg and Versed 6 mg IV TOPICAL ANESTHETIC:   Viscous xylocaine-15 cc PO.  DESCRIPTION OF PROCEDURE: After the risks benefits and alternatives of the procedure were thoroughly explained, informed consent was obtained.  The Pentax [id number] 3.8 [id number]  was introduced through the mouth and advanced to the second portion of the duodenum , without limitations. The instrument was slowly withdrawn as the mucosa was fully examined.   The esophagus, GEJ and the proximal small bowel appeared normal. There were a few nodular erosions in the midbody that were biopsied for pathology. No masses or polyps noted. Retroflexed views revealed no abnormalities. The scope was then withdrawn from the patient and the procedure terminated. The patient tolerated the procedure without immediate complications.  IMPRESSION:  1) Normal appearing, widely patent esophagus. 2) Nodular lesions in midbody of stomach with erosions-biospied for pathology. 3) Normal proximal small bowel.  RECOMMENDATIONS:     1.  Await pathology results. 2.  Anti-reflux regimen to be followed. 3.  Avoid NSAIDS for two weeks. 4.  OP follow-up in 2 weeks.  REPEAT EXAM:  No  recall due to age.[name]  DISCHARGE INSTRUCTIONS: standard discharge instructions given.  _______________________________ eSigned:  Dr. Lorenza Burton, MD August 09, 2013 5:54 PM   CPT CODES:     [phone], EGD with biopsy  DIAGNOSIS CODES:     280.9, 789.06   CC: Rinaldo Cloud, M.D.  PATIENT NAME:  [name], [name] MR#: [id number]

## 2013-07-11 NOTE — H&P (Signed)
[name] is an 79 y.o. female.   Chief Complaint: Colorectal cancer screening. HPI: 79 year old black female, here for colorectal cancer screening. She has a history of chronic constipation and occasional rectal bleeding. She also has a history of reflux. She was having some dysphagia that resolved after she took PPI's for a while. Refer to office notes for further details.   Past Medical History  Diagnosis Date  . Diabetes mellitus without complication   . Stroke    Past Surgical History  Procedure Laterality Date  . Capsulotomy  05/11/2012    Procedure: MINOR CAPSULOTOMY;  Surgeon: Vita Erm., MD;  Location: [facility] ENDOSCOPY;  Service: Ophthalmology;  Laterality: Left;  Yag capsulotomy  . Coronary artery bypass graft     History reviewed. No pertinent family history. Social History:  reports that she has quit smoking. She does not have any smokeless tobacco history on file. She reports that she does not drink alcohol or use illicit drugs.  Allergies: No Known Allergies  Medications Prior to Admission  Medication Sig Dispense Refill  . amLODipine (NORVASC) 5 MG tablet Take 1 tablet (5 mg total) by mouth daily.  30 tablet  3  . aspirin EC 81 MG tablet Take 81 mg by mouth daily as needed for pain.      [name] glipiZIDE (GLUCOTROL XL) 10 MG 24 hr tablet Take 10 mg by mouth daily.      . isosorbide mononitrate (IMDUR) 30 MG 24 hr tablet Take 60 mg by mouth daily.      . metFORMIN (GLUCOPHAGE) 500 MG tablet Take 1-2 tablets (500-1,000 mg total) by mouth 2 (two) times daily. 2 tablets (1000 mg) with lunch and 1 tablet (500 mg) with dinner  60 tablet  3  . metoprolol succinate (TOPROL XL) 50 MG 24 hr tablet Take 1 tablet (50 mg total) by mouth daily. Take with or immediately following a meal.  30 tablet  3  . pantoprazole (PROTONIX) 40 MG tablet Take 1 tablet (40 mg total) by mouth daily at 6 (six) AM.  30 tablet  3  . ramipril (ALTACE) 10 MG capsule Take 10 mg by mouth daily.       . rosuvastatin (CRESTOR) 10 MG tablet Take 10 mg by mouth daily.      . nitroGLYCERIN (NITROSTAT) 0.4 MG SL tablet Place 1 tablet (0.4 mg total) under the tongue every 5 (five) minutes as needed for chest pain.  25 tablet  3   Results for orders placed during the hospital encounter of 07/11/13 (from the past 48 hour(s))  GLUCOSE, CAPILLARY     Status: None   Collection Time    07/11/13  3:05 PM      Result Value Range   Glucose-Capillary 89  70 - 99 mg/dL   No results found.  Review of Systems  Constitutional: Negative.   HENT: Negative.   Eyes: Negative.   Respiratory: Negative.   Cardiovascular: Negative.   Gastrointestinal: Negative.   Genitourinary: Negative.   Musculoskeletal: Negative.   Skin: Negative.   Neurological: Negative.   Endo/Heme/Allergies: Negative.   Psychiatric/Behavioral: Negative.    Blood pressure 161/85, temperature 98.5 F (36.9 C), temperature source Oral, resp. rate 15, height 5\' 4"  (1.626 m), weight 73.483 kg (162 lb), SpO2 99.00%. Physical Exam  Constitutional: She is oriented to person, place, and time. She appears well-developed and well-nourished.  HENT:  Head: Normocephalic and atraumatic.  Eyes: Conjunctivae and EOM are normal. Pupils are  equal, round, and reactive to light.  Neck: Normal range of motion. Neck supple.  Cardiovascular: Normal rate and regular rhythm.   Respiratory: Effort normal and breath sounds normal.  GI: Soft. Bowel sounds are normal.  Musculoskeletal: Normal range of motion.  Neurological: She is alert and oriented to person, place, and time.  Skin: Skin is warm and dry.  Psychiatric: She has a normal mood and affect. Her behavior is normal. Judgment and thought content normal.   Assessment/Plan Colorectal cancer screening/GERD-chronic constipation with occasional rectal bleeding. Proceed with an EGD and a colonoscopy at this time.   [name] 07/11/2013, 3:34 PM

## 2013-07-11 NOTE — Progress Notes (Signed)
Patient turned multiple times during colonoscopy from LLD to supine then RLD  Unable to reach cecum  Even with multiple position changes & pressure to abdomen also tried on order of DR Loreta Ave.

## 2013-07-12 ENCOUNTER — Encounter (HOSPITAL_COMMUNITY): Payer: Self-pay | Admitting: Gastroenterology

## 2013-12-03 ENCOUNTER — Emergency Department (HOSPITAL_COMMUNITY): Payer: Medicare Other

## 2013-12-03 ENCOUNTER — Encounter (HOSPITAL_COMMUNITY): Payer: Self-pay | Admitting: Emergency Medicine

## 2013-12-03 ENCOUNTER — Emergency Department (HOSPITAL_COMMUNITY)
Admission: EM | Admit: 2013-12-03 | Discharge: 2013-12-03 | Disposition: A | Discharge status: Home / Self Care | Payer: Medicare Other | Attending: Emergency Medicine | Admitting: Emergency Medicine

## 2013-12-03 DIAGNOSIS — E785 Hyperlipidemia, unspecified: Secondary | ICD-10-CM | POA: Insufficient documentation

## 2013-12-03 DIAGNOSIS — E119 Type 2 diabetes mellitus without complications: Secondary | ICD-10-CM | POA: Insufficient documentation

## 2013-12-03 DIAGNOSIS — Z8673 Personal history of transient ischemic attack (TIA), and cerebral infarction without residual deficits: Secondary | ICD-10-CM | POA: Insufficient documentation

## 2013-12-03 DIAGNOSIS — Z87891 Personal history of nicotine dependence: Secondary | ICD-10-CM | POA: Insufficient documentation

## 2013-12-03 DIAGNOSIS — I10 Essential (primary) hypertension: Secondary | ICD-10-CM | POA: Insufficient documentation

## 2013-12-03 DIAGNOSIS — Z7982 Long term (current) use of aspirin: Secondary | ICD-10-CM | POA: Insufficient documentation

## 2013-12-03 DIAGNOSIS — R112 Nausea with vomiting, unspecified: Secondary | ICD-10-CM | POA: Insufficient documentation

## 2013-12-03 DIAGNOSIS — Z79899 Other long term (current) drug therapy: Secondary | ICD-10-CM | POA: Insufficient documentation

## 2013-12-03 HISTORY — DX: Essential (primary) hypertension: I10

## 2013-12-03 HISTORY — DX: Hyperlipidemia, unspecified: E78.5

## 2013-12-03 MED ORDER — ONDANSETRON HCL 4 MG PO TABS: 4.0000 mg | ORAL_TABLET | Freq: Four times a day (QID) | ORAL | Status: DC

## 2013-12-03 MED ORDER — ONDANSETRON 4 MG PO TBDP
4.0000 mg | ORAL_TABLET | Freq: Once | ORAL | Status: AC
  Administered 2013-12-03: 4 mg via ORAL
  Filled 2013-12-03: qty 1

## 2013-12-03 MED ORDER — PROMETHAZINE HCL 12.5 MG PO TABS: 12.5000 mg | ORAL_TABLET | Freq: Four times a day (QID) | ORAL | Status: DC | PRN

## 2013-12-03 MED ORDER — TRAMADOL HCL 50 MG PO TABS: 50.0000 mg | ORAL_TABLET | Freq: Four times a day (QID) | ORAL | Status: DC | PRN

## 2013-12-03 MED ORDER — TRAMADOL HCL 50 MG PO TABS
50.0000 mg | ORAL_TABLET | Freq: Once | ORAL | Status: AC
Start: 2013-12-03 — End: 2013-12-03
  Administered 2013-12-03: 50 mg via ORAL
  Filled 2013-12-03: qty 1

## 2013-12-03 NOTE — ED Notes (Signed)
Dr. Kohut at bedside 

## 2013-12-03 NOTE — ED Notes (Signed)
Pt stated she feels much better and would like to be discharged to home.

## 2013-12-03 NOTE — ED Notes (Signed)
Pt from home. States she has had N/V since this am. Pt was prescribed oxycodone for gout yesterday and states she thinks it is what is making her sick. Pt denies diarrhea and abd pain.

## 2013-12-03 NOTE — ED Notes (Signed)
Pt pointed out a knot below sternum hard on palpation and increasing in size.  Her MD is aware.

## 2013-12-03 NOTE — Discharge Instructions (Signed)

## 2013-12-16 NOTE — ED Provider Notes (Signed)
CSN: [id number]     Arrival date & time 12/03/13  1505 History   First MD Initiated Contact with Patient 12/03/13 1528     Chief Complaint  Patient presents with  . Nausea  . Emesis     (Consider location/radiation/quality/duration/timing/severity/associated sxs/prior Treatment) HPI  83yF with n/v. Onset this morning. Denies any abdominal pain. No fever or chills. No diarrhea. Just prescribed oxycodone yesterday for gout. No sick contacts. No urinary complaints. No dizziness, lightheadedness or SOB.    Past Medical History  Diagnosis Date  . Diabetes mellitus without complication   . Stroke   . Hypertension   . Hyperlipidemia    Past Surgical History  Procedure Laterality Date  . Capsulotomy  05/11/2012    Procedure: MINOR CAPSULOTOMY;  Surgeon: Myrtha Mantis., MD;  Location: [facility] ENDOSCOPY;  Service: Ophthalmology;  Laterality: Left;  Yag capsulotomy  . Coronary artery bypass graft    . Esophagogastroduodenoscopy N/A 07/11/2013    Procedure: ESOPHAGOGASTRODUODENOSCOPY (EGD);  Surgeon: Juanita Craver, MD;  Location: [facility] ENDOSCOPY;  Service: Endoscopy;  Laterality: N/A;  . Colonoscopy N/A 07/11/2013    Procedure: COLONOSCOPY;  Surgeon: Juanita Craver, MD;  Location: [facility] ENDOSCOPY;  Service: Endoscopy;  Laterality: N/A;  . Coronary angioplasty with stent placement    . Abdominal hysterectomy     History reviewed. No pertinent family history. History  Substance Use Topics  . Smoking status: Former Research scientist (life sciences)  . Smokeless tobacco: Not on file  . Alcohol Use: No   OB History   Grav Para Term Preterm Abortions TAB SAB Ect Mult Living                 Review of Systems  All systems reviewed and negative, other than as noted in HPI.   Allergies  Review of patient's allergies indicates no known allergies.  Home Medications   Current Outpatient Rx  Name  Route  Sig  Dispense  Refill  . amLODipine (NORVASC) 2.5 MG tablet   Oral   Take 2.5 mg by mouth daily.         [name]  aspirin 81 MG tablet   Oral   Take 81 mg by mouth daily as needed for pain.         [name] glipiZIDE (GLUCOTROL XL) 10 MG 24 hr tablet   Oral   Take 10 mg by mouth daily.         . isosorbide mononitrate (IMDUR) 30 MG 24 hr tablet   Oral   Take 60 mg by mouth daily.         . metFORMIN (GLUCOPHAGE) 500 MG tablet   Oral   Take 500 mg by mouth 3 (three) times daily.         . metoprolol succinate (TOPROL-XL) 50 MG 24 hr tablet   Oral   Take 50 mg by mouth daily. Take with or immediately following a meal.         . nitroGLYCERIN (NITROSTAT) 0.4 MG SL tablet   Sublingual   Place 1 tablet (0.4 mg total) under the tongue every 5 (five) minutes as needed for chest pain.   25 tablet   3   . ramipril (ALTACE) 10 MG capsule   Oral   Take 10 mg by mouth daily.         . ondansetron (ZOFRAN) 4 MG tablet   Oral   Take 1 tablet (4 mg total) by mouth every 6 (six) hours.   15 tablet  0   . promethazine (PHENERGAN) 12.5 MG tablet   Oral   Take 1 tablet (12.5 mg total) by mouth every 6 (six) hours as needed for nausea or vomiting.   15 tablet   0   . traMADol (ULTRAM) 50 MG tablet   Oral   Take 1 tablet (50 mg total) by mouth every 6 (six) hours as needed.   20 tablet   0    BP 153/85  Pulse 85  Temp(Src) 97.6 F (36.4 C) (Oral)  Resp 18  Ht 5\' 5"  (1.651 m)  Wt 165 lb (74.844 kg)  BMI 27.46 kg/m2  SpO2 96% Physical Exam  Nursing note and vitals reviewed. Constitutional: She appears well-developed and well-nourished. No distress.  HENT:  Head: Normocephalic and atraumatic.  Eyes: Conjunctivae are normal. Right eye exhibits no discharge. Left eye exhibits no discharge.  Neck: Neck supple.  Cardiovascular: Normal rate, regular rhythm and normal heart sounds.  Exam reveals no gallop and no friction rub.   No murmur heard. Pulmonary/Chest: Effort normal and breath sounds normal. No respiratory distress.  Abdominal: Soft. She exhibits no distension. There is no  tenderness.  Musculoskeletal: She exhibits no edema and no tenderness.  Neurological: She is alert.  Skin: Skin is warm and dry.  Psychiatric: She has a normal mood and affect. Her behavior is normal. Thought content normal.    ED Course  Procedures (including critical care time) Labs Review Labs Reviewed - No data to display Imaging Review No results found.  Dg Abd 1 View  12/03/2013   CLINICAL DATA:  Abdominal pain and nausea.  EXAM: ABDOMEN - 1 VIEW  COMPARISON:  11/17/2007 and abdomen and pelvis CT dated 04/05/2008.  FINDINGS: Normal bowel gas pattern. Stable arterial atheromatous calcifications in the upper abdomen. Calcification in the will wall of a left renal cyst is again demonstrated. Lumbar and lower thoracic spine degenerative changes.  IMPRESSION: No acute abnormality.   Electronically Signed   By: Enrique Sack M.D.   On: 12/03/2013 17:20    EKG Interpretation None      MDM   Final diagnoses:  Nausea and vomiting    83yF with n/v. Suspect related to oxycodone usage. HD stable. Benign exam.     Virgel Manifold, MD 12/16/13 1014

## 2014-08-31 ENCOUNTER — Encounter (HOSPITAL_COMMUNITY): Payer: Self-pay | Admitting: Cardiology

## 2015-04-24 ENCOUNTER — Other Ambulatory Visit: Payer: Self-pay | Admitting: Gastroenterology

## 2015-04-24 DIAGNOSIS — R1310 Dysphagia, unspecified: Secondary | ICD-10-CM

## 2015-04-27 ENCOUNTER — Ambulatory Visit
Admission: RE | Admit: 2015-04-27 | Discharge: 2015-04-27 | Disposition: A | Discharge status: Home / Self Care | Payer: Medicare HMO | Source: Ambulatory Visit | Attending: Gastroenterology | Admitting: Gastroenterology

## 2015-04-27 DIAGNOSIS — R1310 Dysphagia, unspecified: Secondary | ICD-10-CM

## 2015-09-23 DIAGNOSIS — K922 Gastrointestinal hemorrhage, unspecified: Secondary | ICD-10-CM

## 2015-09-23 HISTORY — DX: Gastrointestinal hemorrhage, unspecified: K92.2

## 2015-10-08 ENCOUNTER — Inpatient Hospital Stay (HOSPITAL_COMMUNITY)
Admission: EM | Admit: 2015-10-08 | Discharge: 2015-10-10 | DRG: 378 | Disposition: A | Discharge status: Home / Self Care | Payer: Medicare HMO | Attending: Cardiology | Admitting: Cardiology

## 2015-10-08 ENCOUNTER — Encounter (HOSPITAL_COMMUNITY): Payer: Self-pay | Admitting: Emergency Medicine

## 2015-10-08 DIAGNOSIS — I10 Essential (primary) hypertension: Secondary | ICD-10-CM | POA: Diagnosis present

## 2015-10-08 DIAGNOSIS — K921 Melena: Principal | ICD-10-CM | POA: Diagnosis present

## 2015-10-08 DIAGNOSIS — Z951 Presence of aortocoronary bypass graft: Secondary | ICD-10-CM | POA: Diagnosis not present

## 2015-10-08 DIAGNOSIS — Z87891 Personal history of nicotine dependence: Secondary | ICD-10-CM

## 2015-10-08 DIAGNOSIS — Z955 Presence of coronary angioplasty implant and graft: Secondary | ICD-10-CM | POA: Diagnosis not present

## 2015-10-08 DIAGNOSIS — D62 Acute posthemorrhagic anemia: Secondary | ICD-10-CM | POA: Diagnosis present

## 2015-10-08 DIAGNOSIS — M1990 Unspecified osteoarthritis, unspecified site: Secondary | ICD-10-CM | POA: Diagnosis present

## 2015-10-08 DIAGNOSIS — K922 Gastrointestinal hemorrhage, unspecified: Secondary | ICD-10-CM | POA: Diagnosis present

## 2015-10-08 DIAGNOSIS — Z8673 Personal history of transient ischemic attack (TIA), and cerebral infarction without residual deficits: Secondary | ICD-10-CM | POA: Diagnosis not present

## 2015-10-08 DIAGNOSIS — E7800 Pure hypercholesterolemia, unspecified: Secondary | ICD-10-CM | POA: Diagnosis present

## 2015-10-08 DIAGNOSIS — I2510 Atherosclerotic heart disease of native coronary artery without angina pectoris: Secondary | ICD-10-CM | POA: Diagnosis present

## 2015-10-08 DIAGNOSIS — E119 Type 2 diabetes mellitus without complications: Secondary | ICD-10-CM | POA: Diagnosis present

## 2015-10-08 HISTORY — DX: Gastrointestinal hemorrhage, unspecified: K92.2

## 2015-10-08 HISTORY — DX: Atherosclerotic heart disease of native coronary artery without angina pectoris: I25.10

## 2015-10-08 LAB — ABO/RH: ABO/RH(D): O POS

## 2015-10-08 LAB — CBC
HCT: 26.9 % — ABNORMAL LOW (ref 36.0–46.0)
HEMOGLOBIN: 8.3 g/dL — AB (ref 12.0–15.0)
MCH: 21.1 pg — ABNORMAL LOW (ref 26.0–34.0)
MCHC: 30.9 g/dL (ref 30.0–36.0)
MCV: 68.3 fL — AB (ref 78.0–100.0)
Platelets: 175 10*3/uL (ref 150–400)
RBC: 3.94 MIL/uL (ref 3.87–5.11)
RDW: 16.2 % — ABNORMAL HIGH (ref 11.5–15.5)
WBC: 6.1 10*3/uL (ref 4.0–10.5)

## 2015-10-08 LAB — I-STAT CG4 LACTIC ACID, ED
LACTIC ACID, VENOUS: 2.27 mmol/L — AB (ref 0.5–2.0)
LACTIC ACID, VENOUS: 2.41 mmol/L — AB (ref 0.5–2.0)

## 2015-10-08 LAB — COMPREHENSIVE METABOLIC PANEL
ALBUMIN: 3.3 g/dL — AB (ref 3.5–5.0)
ALK PHOS: 79 U/L (ref 38–126)
ALT: 10 U/L — AB (ref 14–54)
ANION GAP: 12 (ref 5–15)
AST: 18 U/L (ref 15–41)
BUN: 18 mg/dL (ref 6–20)
CALCIUM: 8.9 mg/dL (ref 8.9–10.3)
CHLORIDE: 110 mmol/L (ref 101–111)
CO2: 19 mmol/L — ABNORMAL LOW (ref 22–32)
Creatinine, Ser: 1.88 mg/dL — ABNORMAL HIGH (ref 0.44–1.00)
GFR calc non Af Amer: 24 mL/min — ABNORMAL LOW (ref >60)
GFR, EST AFRICAN AMERICAN: 28 mL/min — AB (ref >60)
Glucose, Bld: 104 mg/dL — ABNORMAL HIGH (ref 65–99)
Potassium: 3.7 mmol/L (ref 3.5–5.1)
Sodium: 141 mmol/L (ref 135–145)
Total Bilirubin: 0.3 mg/dL (ref 0.3–1.2)
Total Protein: 7.1 g/dL (ref 6.5–8.1)

## 2015-10-08 LAB — POC OCCULT BLOOD, ED: FECAL OCCULT BLD: POSITIVE — AB

## 2015-10-08 LAB — TYPE AND SCREEN
ABO/RH(D): O POS
Antibody Screen: NEGATIVE

## 2015-10-08 MED ORDER — SODIUM CHLORIDE 0.9 % IJ SOLN
3.0000 mL | Freq: Two times a day (BID) | INTRAMUSCULAR | Status: DC
  Administered 2015-10-10: 3 mL via INTRAVENOUS

## 2015-10-08 MED ORDER — ACETAMINOPHEN 325 MG PO TABS: 650.0000 mg | ORAL_TABLET | Freq: Four times a day (QID) | ORAL | Status: DC | PRN

## 2015-10-08 MED ORDER — ONDANSETRON HCL 4 MG PO TABS: 4.0000 mg | ORAL_TABLET | Freq: Four times a day (QID) | ORAL | Status: DC | PRN

## 2015-10-08 MED ORDER — ONDANSETRON HCL 4 MG/2ML IJ SOLN: 4.0000 mg | Freq: Four times a day (QID) | INTRAMUSCULAR | Status: DC | PRN

## 2015-10-08 MED ORDER — ACETAMINOPHEN 650 MG RE SUPP: 650.0000 mg | Freq: Four times a day (QID) | RECTAL | Status: DC | PRN

## 2015-10-08 MED ORDER — SODIUM CHLORIDE 0.9 % IV BOLUS (SEPSIS)
500.0000 mL | Freq: Once | INTRAVENOUS | Status: AC
  Administered 2015-10-08: 500 mL via INTRAVENOUS

## 2015-10-08 MED ORDER — SODIUM CHLORIDE 0.9 % IV SOLN
INTRAVENOUS | Status: DC
  Administered 2015-10-08 – 2015-10-10 (×4): via INTRAVENOUS

## 2015-10-08 NOTE — H&P (Signed)
Referring Physician:  BERETTA GINSBERG is an 82 y.o. female.                       Chief Complaint: Bleeding per rectum  HPI: 82 y.o. female with a history of stroke, CABG, hypertension and hyperlipidemia comes in for evaluation of rectal bleeding. Patient reports she had sudden onset rectal bleeding today  2 with loose stool. She reports bright red blood in the toilet. She denies any fevers, chills, nausea or vomiting, abdominal pain, chest pain or shortness of breath. She does report dizziness throughout the day. Takes aspirin 81 mg but no other anticoagulation. Last colonoscopy in 2014 for colorectal cancer screening was negative. In ER stool positive for occult blood and Hemoglobin down to 8.3  Past Medical History  Diagnosis Date  . Diabetes mellitus without complication ([place])   . Stroke ([place])   . Hypertension   . Hyperlipidemia       Past Surgical History  Procedure Laterality Date  . Capsulotomy  05/11/2012    Procedure: MINOR CAPSULOTOMY;  Surgeon: Myrtha Mantis., MD;  Location: [facility] ENDOSCOPY;  Service: Ophthalmology;  Laterality: Left;  Yag capsulotomy  . Coronary artery bypass graft    . Esophagogastroduodenoscopy N/A 07/11/2013    Procedure: ESOPHAGOGASTRODUODENOSCOPY (EGD);  Surgeon: Juanita Craver, MD;  Location: [facility] ENDOSCOPY;  Service: Endoscopy;  Laterality: N/A;  . Colonoscopy N/A 07/11/2013    Procedure: COLONOSCOPY;  Surgeon: Juanita Craver, MD;  Location: [facility] ENDOSCOPY;  Service: Endoscopy;  Laterality: N/A;  . Coronary angioplasty with stent placement    . Abdominal hysterectomy    . Left heart catheterization with coronary/graft angiogram N/A 12/09/2012    Procedure: LEFT HEART CATHETERIZATION WITH CORONARY/GRAFT ANGIOGRAM;  Surgeon: Clent Demark, MD;  Location: [facility] CATH LAB;  Service: Cardiovascular;  Laterality: N/A;    No family history on file. Social History:  reports that she has quit smoking. She does not have any smokeless tobacco history on file. She  reports that she does not drink alcohol or use illicit drugs.  Allergies: No Known Allergies   (Not in a hospital admission)  Results for orders placed or performed during the hospital encounter of 10/08/15 (from the past 48 hour(s))  Type and screen [place]     Status: None   Collection Time: 10/08/15  6:08 PM  Result Value Ref Range   ABO/RH(D) O POS    Antibody Screen NEG    Sample Expiration 10/11/2015   ABO/Rh     Status: None   Collection Time: 10/08/15  6:08 PM  Result Value Ref Range   ABO/RH(D) O POS   Comprehensive metabolic panel     Status: Abnormal   Collection Time: 10/08/15  6:19 PM  Result Value Ref Range   Sodium 141 135 - 145 mmol/L   Potassium 3.7 3.5 - 5.1 mmol/L   Chloride 110 101 - 111 mmol/L   CO2 19 (L) 22 - 32 mmol/L   Glucose, Bld 104 (H) 65 - 99 mg/dL   BUN 18 6 - 20 mg/dL   Creatinine, Ser 1.88 (H) 0.44 - 1.00 mg/dL   Calcium 8.9 8.9 - 10.3 mg/dL   Total Protein 7.1 6.5 - 8.1 g/dL   Albumin 3.3 (L) 3.5 - 5.0 g/dL   AST 18 15 - 41 U/L   ALT 10 (L) 14 - 54 U/L   Alkaline Phosphatase 79 38 - 126 U/L   Total Bilirubin 0.3 0.3 -  1.2 mg/dL   GFR calc non Af Amer 24 (L) >60 mL/min   GFR calc Af Amer 28 (L) >60 mL/min    Comment: (NOTE) The eGFR has been calculated using the CKD EPI equation. This calculation has not been validated in all clinical situations. eGFR's persistently <60 mL/min signify possible Chronic Kidney Disease.    Anion gap 12 5 - 15  CBC     Status: Abnormal   Collection Time: 10/08/15  6:19 PM  Result Value Ref Range   WBC 6.1 4.0 - 10.5 K/uL   RBC 3.94 3.87 - 5.11 MIL/uL   Hemoglobin 8.3 (L) 12.0 - 15.0 g/dL   HCT 26.9 (L) 36.0 - 46.0 %   MCV 68.3 (L) 78.0 - 100.0 fL   MCH 21.1 (L) 26.0 - 34.0 pg   MCHC 30.9 30.0 - 36.0 g/dL   RDW 16.2 (H) 11.5 - 15.5 %   Platelets 175 150 - 400 K/uL  I-Stat CG4 Lactic Acid, ED     Status: Abnormal   Collection Time: 10/08/15  6:21 PM  Result Value Ref Range    Lactic Acid, Venous 2.27 (HH) 0.5 - 2.0 mmol/L   Comment NOTIFIED PHYSICIAN   POC occult blood, ED     Status: Abnormal   Collection Time: 10/08/15  7:33 PM  Result Value Ref Range   Fecal Occult Bld POSITIVE (A) NEGATIVE   No results found.  Review Of Systems Constitutional: Negative for fever.  HENT: Negative for hearing loss.  Eyes: Negative for blurred vision and double vision. Wears reading glasses. Cardiovascular: Positive for chest pain. Negative for palpitations, orthopnea, claudication and leg swelling.  Gastrointestinal: Negative for nausea and vomiting. Positive for GI bleed Genitourinary: Negative for dysuria.  Neurological: Negative for dizziness and headaches.   Blood pressure 135/86, pulse 89, temperature 98.8 F (37.1 C), temperature source Oral, resp. rate 19, height '5\' 4"'$  (1.626 m), weight 73.029 kg (161 lb), SpO2 98 %.  Physical Exam  Constitutional:  She appears well-developed and well-nourished.   HENT: Normocephalic and atraumatic. Oropharynx is clear and moist.  Eyes: Brown eyes, Conjunctivae are pale. Pupils are equal, round, and reactive to light. Right eye exhibits no discharge. Left eye exhibits no discharge. No scleral icterus.  Neck: Neck supple. No thyromegaly. Cardiovascular: Normal rate, regular rhythm and normal heart sounds. II/VI systolic murmur  Pulmonary/Chest: Effort normal and breath sounds normal.   Abdominal: Soft. Diffuse abdominal tenderness with palpation. No distention.  Musculoskeletal: She exhibits no tenderness.  Neurological: She is alert and oriented to person, place, and time. Cranial Nerves II-XII grossly intact. Skin: Skin is warm and dry. No rash noted.  Psychiatric: She has a normal mood and affect.  Nursing note and vitals reviewed.  Assessment/Plan Acute lower GI bleed Coronary artery disease history of CABG x2 in the past Hypertension Diabetes mellitus History of TIA Hypercholesteremia Chronic anemia Degenerative  joint disease  Admit/Hold Aspirin/IV fluids/Transfuse blood as needed/GI consult  Birdie Riddle, MD  10/08/2015, 9:50 PM

## 2015-10-08 NOTE — ED Notes (Signed)
From home via [facility] with 2 episodes of BRBPR, VSS, denies pain, no vomiting, NAD

## 2015-10-08 NOTE — ED Provider Notes (Signed)
CSN: [id number]     Arrival date & time 10/08/15  1744 History   First MD Initiated Contact with Patient 10/08/15 1845     Chief Complaint  Patient presents with  . Rectal Bleeding     (Consider location/radiation/quality/duration/timing/severity/associated sxs/prior Treatment) HPI [name] is a 82 y.o. female with a history of stroke, CABG, hypertension and hyperlipidemia comes in for evaluation of rectal bleeding. Patient reports she had sudden onset rectal bleeding today 2 with loose stool. She reports bright red blood in the toilet. She denies any fevers, chills, nausea or vomiting, abdominal pain, chest pain or shortness of breath. She does report dizziness throughout the day. Takes aspirin 81 mg but no other anticoagulation. Nothing makes the problem better or worse. No other modifying factors. Last colonoscopy 2014 for colorectal cancer screening and was negative.  Past Medical History  Diagnosis Date  . Diabetes mellitus without complication ([place])   . Stroke ([place])   . Hypertension   . Hyperlipidemia    Past Surgical History  Procedure Laterality Date  . Capsulotomy  05/11/2012    Procedure: MINOR CAPSULOTOMY;  Surgeon: Myrtha Mantis., MD;  Location: [facility] ENDOSCOPY;  Service: Ophthalmology;  Laterality: Left;  Yag capsulotomy  . Coronary artery bypass graft    . Esophagogastroduodenoscopy N/A 07/11/2013    Procedure: ESOPHAGOGASTRODUODENOSCOPY (EGD);  Surgeon: Juanita Craver, MD;  Location: [facility] ENDOSCOPY;  Service: Endoscopy;  Laterality: N/A;  . Colonoscopy N/A 07/11/2013    Procedure: COLONOSCOPY;  Surgeon: Juanita Craver, MD;  Location: [facility] ENDOSCOPY;  Service: Endoscopy;  Laterality: N/A;  . Coronary angioplasty with stent placement    . Abdominal hysterectomy    . Left heart catheterization with coronary/graft angiogram N/A 12/09/2012    Procedure: LEFT HEART CATHETERIZATION WITH CORONARY/GRAFT ANGIOGRAM;  Surgeon: Clent Demark, MD;  Location: [facility] CATH LAB;   Service: Cardiovascular;  Laterality: N/A;   No family history on file. Social History  Substance Use Topics  . Smoking status: Former Research scientist (life sciences)  . Smokeless tobacco: None  . Alcohol Use: No   OB History    No data available     Review of Systems A 10 point review of systems was completed and was negative except for pertinent positives and negatives as mentioned in the history of present illness     Allergies  Review of patient's allergies indicates no known allergies.  Home Medications   Prior to Admission medications   Medication Sig Start Date End Date Taking? Authorizing Provider  allopurinol (ZYLOPRIM) 300 MG tablet Take 150 mg by mouth daily.   Yes Historical Provider, MD  amLODipine (NORVASC) 5 MG tablet Take 5 mg by mouth daily.   Yes Historical Provider, MD  aspirin 81 MG tablet Take 81 mg by mouth daily as needed for pain.   Yes Historical Provider, MD  glipiZIDE (GLUCOTROL XL) 10 MG 24 hr tablet Take 10 mg by mouth daily.   Yes Historical Provider, MD  isosorbide mononitrate (IMDUR) 30 MG 24 hr tablet Take 60 mg by mouth daily.   Yes Historical Provider, MD  metFORMIN (GLUCOPHAGE) 500 MG tablet Take 500 mg by mouth 2 (two) times daily with a meal.    Yes Historical Provider, MD  metoprolol (LOPRESSOR) 50 MG tablet Take 50 mg by mouth daily.   Yes Historical Provider, MD  ramipril (ALTACE) 10 MG capsule Take 10 mg by mouth daily.   Yes Historical Provider, MD  simvastatin (ZOCOR) 40 MG tablet Take 40 mg by mouth  daily.   Yes Historical Provider, MD  nitroGLYCERIN (NITROSTAT) 0.4 MG SL tablet Place 1 tablet (0.4 mg total) under the tongue every 5 (five) minutes as needed for chest pain. 12/09/12   Charolette Forward, MD  ondansetron (ZOFRAN) 4 MG tablet Take 1 tablet (4 mg total) by mouth every 6 (six) hours. Patient not taking: Reported on 10/08/2015 12/03/13   Virgel Manifold, MD  promethazine (PHENERGAN) 12.5 MG tablet Take 1 tablet (12.5 mg total) by mouth every 6 (six) hours as  needed for nausea or vomiting. Patient not taking: Reported on 10/08/2015 12/03/13   Virgel Manifold, MD  traMADol (ULTRAM) 50 MG tablet Take 1 tablet (50 mg total) by mouth every 6 (six) hours as needed. Patient not taking: Reported on 10/08/2015 12/03/13   Virgel Manifold, MD   BP 124/42 mmHg  Pulse 83  Temp(Src) 98.8 F (37.1 C) (Oral)  Resp 18  Ht 5\' 4"  (1.626 m)  Wt 73.029 kg  BMI 27.62 kg/m2  SpO2 99% Physical Exam  Constitutional: She is oriented to person, place, and time. She appears well-developed and well-nourished.  Well-appearing African American female.  HENT:  Head: Normocephalic and atraumatic.  Mouth/Throat: Oropharynx is clear and moist.  Eyes: Conjunctivae are normal. Pupils are equal, round, and reactive to light. Right eye exhibits no discharge. Left eye exhibits no discharge. No scleral icterus.  Neck: Neck supple.  Cardiovascular: Normal rate, regular rhythm and normal heart sounds.   Pulmonary/Chest: Effort normal and breath sounds normal. No respiratory distress. She has no wheezes. She has no rales.  Abdominal: Soft.  Diffuse abdominal tenderness with palpation. No distention.  Genitourinary: Guaiac positive stool.  Chaperone present for rectal exam. No frank blood or other abnormalities.  Musculoskeletal: She exhibits no tenderness.  Neurological: She is alert and oriented to person, place, and time.  Cranial Nerves II-XII grossly intact  Skin: Skin is warm and dry. No rash noted.  Psychiatric: She has a normal mood and affect.  Nursing note and vitals reviewed.   ED Course  Procedures (including critical care time) Labs Review Labs Reviewed  COMPREHENSIVE METABOLIC PANEL - Abnormal; Notable for the following:    CO2 19 (*)    Glucose, Bld 104 (*)    Creatinine, Ser 1.88 (*)    Albumin 3.3 (*)    ALT 10 (*)    GFR calc non Af Amer 24 (*)    GFR calc Af Amer 28 (*)    All other components within normal limits  CBC - Abnormal; Notable for the  following:    Hemoglobin 8.3 (*)    HCT 26.9 (*)    MCV 68.3 (*)    MCH 21.1 (*)    RDW 16.2 (*)    All other components within normal limits  POC OCCULT BLOOD, ED - Abnormal; Notable for the following:    Fecal Occult Bld POSITIVE (*)    All other components within normal limits  I-STAT CG4 LACTIC ACID, ED - Abnormal; Notable for the following:    Lactic Acid, Venous 2.27 (*)    All other components within normal limits  OCCULT BLOOD X 1 CARD TO LAB, STOOL  I-STAT CG4 LACTIC ACID, ED  TYPE AND SCREEN  ABO/RH    Imaging Review No results found. I have personally reviewed and evaluated these images and lab results as part of my medical decision-making.   EKG Interpretation None     Meds given in ED:  Medications  sodium chloride 0.9 %  bolus 500 mL (500 mLs Intravenous New Bag/Given 10/08/15 2037)    New Prescriptions   No medications on file   Filed Vitals:   10/08/15 1915 10/08/15 1930 10/08/15 1945 10/08/15 2000  BP: 144/84 172/67 148/63 124/42  Pulse: 78 81 81 83  Temp:      TempSrc:      Resp: 16 18 20 18   Height:      Weight:      SpO2: 99% 100% 98% 99%    MDM  [name] is a 81 y.o. female with history of stroke, CABG, diabetes, hypertension, comes in for evaluation of rectal bleeding. Patient had 2 episodes of loose stool a day with blood on the stool and in the commode. No fevers, chills, nausea or vomiting or antibiotic use. On arrival, she is hemodynamically stable, afebrile. She does report associated dizziness throughout the day but no chest pain or shortness of breath. On exam, she does have a diffusely tender abdomen with palpation without any focal tenderness. Fecal occult blood test is positive, but there is no frank blood on exam. Patient's hemoglobin 2 years ago was 10, today is 8.1. Creatinine is elevated at 1.88 with a GFR of 28. Her blood type is O+. She does have an elevated lactic acid at 2.27. Plan for admission for GI bleed. Discussed  with Dr. Doylene Canard, will see in ED. Final diagnoses:  Gastrointestinal hemorrhage, unspecified gastritis, unspecified gastrointestinal hemorrhage type       Comer Locket, PA-C 10/09/15 [place], MD 10/09/15 [id number]

## 2015-10-09 ENCOUNTER — Encounter (HOSPITAL_COMMUNITY): Payer: Self-pay | Admitting: General Practice

## 2015-10-09 LAB — GLUCOSE, CAPILLARY
GLUCOSE-CAPILLARY: 84 mg/dL (ref 65–99)
GLUCOSE-CAPILLARY: 87 mg/dL (ref 65–99)
Glucose-Capillary: 121 mg/dL — ABNORMAL HIGH (ref 65–99)

## 2015-10-09 LAB — BASIC METABOLIC PANEL
ANION GAP: 8 (ref 5–15)
BUN: 13 mg/dL (ref 6–20)
CO2: 22 mmol/L (ref 22–32)
Calcium: 8.8 mg/dL — ABNORMAL LOW (ref 8.9–10.3)
Chloride: 111 mmol/L (ref 101–111)
Creatinine, Ser: 1.58 mg/dL — ABNORMAL HIGH (ref 0.44–1.00)
GFR calc Af Amer: 34 mL/min — ABNORMAL LOW (ref >60)
GFR, EST NON AFRICAN AMERICAN: 29 mL/min — AB (ref >60)
GLUCOSE: 82 mg/dL (ref 65–99)
POTASSIUM: 4.2 mmol/L (ref 3.5–5.1)
SODIUM: 141 mmol/L (ref 135–145)

## 2015-10-09 LAB — CBC
HEMATOCRIT: 26.1 % — AB (ref 36.0–46.0)
HEMOGLOBIN: 7.9 g/dL — AB (ref 12.0–15.0)
MCH: 20.9 pg — ABNORMAL LOW (ref 26.0–34.0)
MCHC: 30.3 g/dL (ref 30.0–36.0)
MCV: 69 fL — ABNORMAL LOW (ref 78.0–100.0)
Platelets: 157 10*3/uL (ref 150–400)
RBC: 3.78 MIL/uL — AB (ref 3.87–5.11)
RDW: 16.3 % — ABNORMAL HIGH (ref 11.5–15.5)
WBC: 5.6 10*3/uL (ref 4.0–10.5)

## 2015-10-09 MED ORDER — INSULIN ASPART 100 UNIT/ML ~~LOC~~ SOLN: 0.0000 [IU] | Freq: Three times a day (TID) | SUBCUTANEOUS | Status: DC

## 2015-10-09 NOTE — Progress Notes (Signed)
Pt currently has orders for clear liquid diet, but is requesting solid food diet.  After speaking with Dr. Terrence Dupont, the diet is to remain unchanged, as we are waiting for GI consult to assess pt.

## 2015-10-09 NOTE — Progress Notes (Signed)
Subjective:  Complains of vague abdominal pain no further bright red blood per rectum. Denies any chest pain or shortness of breath.  Objective:  Vital Signs in the last 24 hours: Temp:  [98.1 F (36.7 C)-98.8 F (37.1 C)] 98.7 F (37.1 C) (01/17 0752) Pulse Rate:  [70-102] 73 (01/17 0752) Resp:  [15-22] 18 (01/17 0752) BP: (100-172)/(40-109) 152/48 mmHg (01/17 0752) SpO2:  [98 %-100 %] 100 % (01/17 0752) Weight:  [70.4 kg (155 lb 3.3 oz)-73.029 kg (161 lb)] 71.1 kg (156 lb 12 oz) (01/17 [id number])  Intake/Output from previous day: 01/16 0701 - 01/17 0700 In: 613.8 [P.O.:120; I.V.:493.8] Out: 470 [Urine:470] Intake/Output from this shift: Total I/O In: 360 [P.O.:360] Out: 401 [Urine:400; Stool:1]  Physical Exam: Neck: no adenopathy, no carotid bruit, no JVD and supple, symmetrical, trachea midline Lungs: clear to auscultation bilaterally Heart: regular rate and rhythm, S1, S2 normal and 2/6 systolic murmur noted no S3 gallop Abdomen: Soft bowel sounds present and mild generalized tenderness no guarding Extremities: extremities normal, atraumatic, no cyanosis or edema  Lab Results:  Recent Labs  10/08/15 1819 10/09/15 0536  WBC 6.1 5.6  HGB 8.3* 7.9*  PLT 175 157    Recent Labs  10/08/15 1819 10/09/15 0536  NA 141 141  K 3.7 4.2  CL 110 111  CO2 19* 22  GLUCOSE 104* 82  BUN 18 13  CREATININE 1.88* 1.58*   No results for input(s): TROPONINI in the last 72 hours.  Invalid input(s): CK, MB Hepatic Function Panel  Recent Labs  10/08/15 1819  PROT 7.1  ALBUMIN 3.3*  AST 18  ALT 10*  ALKPHOS 79  BILITOT 0.3   No results for input(s): CHOL in the last 72 hours. No results for input(s): PROTIME in the last 72 hours.  Imaging: Imaging results have been reviewed and No results found.  Cardiac Studies:  Assessment/Plan:  Acute lower GI bleed Coronary artery disease history of CABG x2 in the past Hypertension Diabetes mellitus History of  TIA Hypercholesteremia Chronic anemia Degenerative joint disease Plan Continue present management Check labs in a.m. GI consult  LOS: 1 day    Charolette Forward 10/09/2015, 10:20 AM

## 2015-10-09 NOTE — Consult Note (Signed)
Reason for Consult: Hematochezia Referring Physician: Charolette Forward, M.D.  Jerrye Bushy HPI: This is an 82 year old female with a PMH of HTN, IDA, CVA, and DM admitted with complaints of hematochezia.  The symptom started acutely today and she had two bouts of bleeding.  Her admission HGB is at 8.3 g/dL and the only other HGB value was from 2014 at 10.5 g/dL.  In 06/2013 the patient underwent an EGD/Colonoscopy with Dr. Collene Mares for IDA.  The colonoscopy was revealing for right sided lipomas and a couple of right sided tubular adenomas.  The EGD was negative.  A prior CT scan did reveal a small transverse colon diverticula, but Dr. Collene Mares did not see any diverticula during the colonoscopy.  Since admission yesterday morning she denies any further bleeding.  Past Medical History  Diagnosis Date  . Stroke ([place])   . Hypertension   . Hyperlipidemia   . GI bleeding 09/2015  . Coronary artery disease   . Diabetes mellitus without complication ([place])     TYPE 2     Past Surgical History  Procedure Laterality Date  . Capsulotomy  05/11/2012    Procedure: MINOR CAPSULOTOMY;  Surgeon: Myrtha Mantis., MD;  Location: [facility] ENDOSCOPY;  Service: Ophthalmology;  Laterality: Left;  Yag capsulotomy  . Coronary artery bypass graft    . Esophagogastroduodenoscopy N/A 07/11/2013    Procedure: ESOPHAGOGASTRODUODENOSCOPY (EGD);  Surgeon: Juanita Craver, MD;  Location: [facility] ENDOSCOPY;  Service: Endoscopy;  Laterality: N/A;  . Colonoscopy N/A 07/11/2013    Procedure: COLONOSCOPY;  Surgeon: Juanita Craver, MD;  Location: [facility] ENDOSCOPY;  Service: Endoscopy;  Laterality: N/A;  . Coronary angioplasty with stent placement    . Abdominal hysterectomy    . Left heart catheterization with coronary/graft angiogram N/A 12/09/2012    Procedure: LEFT HEART CATHETERIZATION WITH CORONARY/GRAFT ANGIOGRAM;  Surgeon: Clent Demark, MD;  Location: [facility] CATH LAB;  Service: Cardiovascular;  Laterality: N/A;    History reviewed. No  pertinent family history.  Social History:  reports that she has quit smoking. She has never used smokeless tobacco. She reports that she does not drink alcohol or use illicit drugs.  Allergies: No Known Allergies  Medications:  Scheduled: . sodium chloride  3 mL Intravenous Q12H   Continuous: . sodium chloride 75 mL/hr at 10/09/15 0902    Results for orders placed or performed during the hospital encounter of 10/08/15 (from the past 24 hour(s))  Type and screen Speed     Status: None   Collection Time: 10/08/15  6:08 PM  Result Value Ref Range   ABO/RH(D) O POS    Antibody Screen NEG    Sample Expiration 10/11/2015   ABO/Rh     Status: None   Collection Time: 10/08/15  6:08 PM  Result Value Ref Range   ABO/RH(D) O POS   Comprehensive metabolic panel     Status: Abnormal   Collection Time: 10/08/15  6:19 PM  Result Value Ref Range   Sodium 141 135 - 145 mmol/L   Potassium 3.7 3.5 - 5.1 mmol/L   Chloride 110 101 - 111 mmol/L   CO2 19 (L) 22 - 32 mmol/L   Glucose, Bld 104 (H) 65 - 99 mg/dL   BUN 18 6 - 20 mg/dL   Creatinine, Ser 1.88 (H) 0.44 - 1.00 mg/dL   Calcium 8.9 8.9 - 10.3 mg/dL   Total Protein 7.1 6.5 - 8.1 g/dL   Albumin 3.3 (L) 3.5 - 5.0  g/dL   AST 18 15 - 41 U/L   ALT 10 (L) 14 - 54 U/L   Alkaline Phosphatase 79 38 - 126 U/L   Total Bilirubin 0.3 0.3 - 1.2 mg/dL   GFR calc non Af Amer 24 (L) >60 mL/min   GFR calc Af Amer 28 (L) >60 mL/min   Anion gap 12 5 - 15  CBC     Status: Abnormal   Collection Time: 10/08/15  6:19 PM  Result Value Ref Range   WBC 6.1 4.0 - 10.5 K/uL   RBC 3.94 3.87 - 5.11 MIL/uL   Hemoglobin 8.3 (L) 12.0 - 15.0 g/dL   HCT 26.9 (L) 36.0 - 46.0 %   MCV 68.3 (L) 78.0 - 100.0 fL   MCH 21.1 (L) 26.0 - 34.0 pg   MCHC 30.9 30.0 - 36.0 g/dL   RDW 16.2 (H) 11.5 - 15.5 %   Platelets 175 150 - 400 K/uL  I-Stat CG4 Lactic Acid, ED     Status: Abnormal   Collection Time: 10/08/15  6:21 PM  Result Value Ref Range    Lactic Acid, Venous 2.27 (HH) 0.5 - 2.0 mmol/L   Comment NOTIFIED PHYSICIAN   POC occult blood, ED     Status: Abnormal   Collection Time: 10/08/15  7:33 PM  Result Value Ref Range   Fecal Occult Bld POSITIVE (A) NEGATIVE  I-Stat CG4 Lactic Acid, ED     Status: Abnormal   Collection Time: 10/08/15 10:00 PM  Result Value Ref Range   Lactic Acid, Venous 2.41 (HH) 0.5 - 2.0 mmol/L   Comment NOTIFIED PHYSICIAN   Basic metabolic panel     Status: Abnormal   Collection Time: 10/09/15  5:36 AM  Result Value Ref Range   Sodium 141 135 - 145 mmol/L   Potassium 4.2 3.5 - 5.1 mmol/L   Chloride 111 101 - 111 mmol/L   CO2 22 22 - 32 mmol/L   Glucose, Bld 82 65 - 99 mg/dL   BUN 13 6 - 20 mg/dL   Creatinine, Ser 1.58 (H) 0.44 - 1.00 mg/dL   Calcium 8.8 (L) 8.9 - 10.3 mg/dL   GFR calc non Af Amer 29 (L) >60 mL/min   GFR calc Af Amer 34 (L) >60 mL/min   Anion gap 8 5 - 15  CBC     Status: Abnormal   Collection Time: 10/09/15  5:36 AM  Result Value Ref Range   WBC 5.6 4.0 - 10.5 K/uL   RBC 3.78 (L) 3.87 - 5.11 MIL/uL   Hemoglobin 7.9 (L) 12.0 - 15.0 g/dL   HCT 26.1 (L) 36.0 - 46.0 %   MCV 69.0 (L) 78.0 - 100.0 fL   MCH 20.9 (L) 26.0 - 34.0 pg   MCHC 30.3 30.0 - 36.0 g/dL   RDW 16.3 (H) 11.5 - 15.5 %   Platelets 157 150 - 400 K/uL  Glucose, capillary     Status: Abnormal   Collection Time: 10/09/15  8:46 AM  Result Value Ref Range   Glucose-Capillary 121 (H) 65 - 99 mg/dL  Glucose, capillary     Status: None   Collection Time: 10/09/15 11:48 AM  Result Value Ref Range   Glucose-Capillary 84 65 - 99 mg/dL  Glucose, capillary     Status: None   Collection Time: 10/09/15  4:21 PM  Result Value Ref Range   Glucose-Capillary 87 65 - 99 mg/dL     No results found.  ROS:  As stated  above in the HPI otherwise negative.  Blood pressure 153/54, pulse 76, temperature 98.7 F (37.1 C), temperature source Oral, resp. rate 18, height 5\' 4"  (1.626 m), weight 71.1 kg (156 lb 12 oz), SpO2 96 %.     PE: Gen: NAD, Alert and Oriented HEENT:  [place]/AT, EOMI Neck: Supple, no LAD Lungs: CTA Bilaterally CV: RRR without M/G/R ABM: Soft, NTND, +BS Ext: No C/C/E Rectal: Normal, no blood, no masses  Assessment/Plan: 1) Hematochezia. 2) Anemia. 3) CAD.   Her HGB has drifted down slightly, but denies any further hematochezia.  If there is no further bleeding by tomorrow morning, she can be discharged home and follow up with Dr. Collene Mares in 1-2 weeks.  The rectal examination was negative for any blood or other overt pathology.  Plan: 1) Follow HGB. 2) Follow clinically for any bleeding.  Azlin Zilberman D 10/09/2015, 5:22 PM

## 2015-10-10 LAB — BASIC METABOLIC PANEL
ANION GAP: 9 (ref 5–15)
BUN: 10 mg/dL (ref 6–20)
CALCIUM: 9 mg/dL (ref 8.9–10.3)
CO2: 22 mmol/L (ref 22–32)
Chloride: 110 mmol/L (ref 101–111)
Creatinine, Ser: 1.46 mg/dL — ABNORMAL HIGH (ref 0.44–1.00)
GFR calc Af Amer: 37 mL/min — ABNORMAL LOW (ref >60)
GFR calc non Af Amer: 32 mL/min — ABNORMAL LOW (ref >60)
GLUCOSE: 143 mg/dL — AB (ref 65–99)
POTASSIUM: 4.3 mmol/L (ref 3.5–5.1)
Sodium: 141 mmol/L (ref 135–145)

## 2015-10-10 LAB — GLUCOSE, CAPILLARY
Glucose-Capillary: 118 mg/dL — ABNORMAL HIGH (ref 65–99)
Glucose-Capillary: 107 mg/dL — ABNORMAL HIGH (ref 65–99)
Glucose-Capillary: 127 mg/dL — ABNORMAL HIGH (ref 65–99)

## 2015-10-10 LAB — CBC
HEMATOCRIT: 29.5 % — AB (ref 36.0–46.0)
HEMOGLOBIN: 9.1 g/dL — AB (ref 12.0–15.0)
MCH: 21.1 pg — AB (ref 26.0–34.0)
MCHC: 30.8 g/dL (ref 30.0–36.0)
MCV: 68.3 fL — ABNORMAL LOW (ref 78.0–100.0)
Platelets: 173 10*3/uL (ref 150–400)
RBC: 4.32 MIL/uL (ref 3.87–5.11)
RDW: 15.9 % — ABNORMAL HIGH (ref 11.5–15.5)
WBC: 5.3 10*3/uL (ref 4.0–10.5)

## 2015-10-10 NOTE — Discharge Summary (Signed)
Discharge summary dictated on 10/10/2015 dictation number is [phone]

## 2015-10-10 NOTE — Progress Notes (Signed)
Orders received for pt discharge.  Discharge summary printed and reviewed with pt.  Explained medication regimen, and pt had no further questions at this time.  IV removed and site remains clean, dry, intact.  Telemetry removed.  Pt in stable condition and awaiting transport. 

## 2015-10-10 NOTE — Discharge Summary (Signed)
[name], [name]:  [id number]  MEDICAL RECORD NO.:  [id number]  LOCATION:  3E07C                        FACILITY:  [facility]  PHYSICIAN:  Allegra Lai. Terrence Dupont, M.D. DATE OF BIRTH:  [birth year]  DATE OF ADMISSION:  10/08/2015 DATE OF DISCHARGE:  10/10/2015                              DISCHARGE SUMMARY   ADMITTING DIAGNOSES: 1. Acute lower gastrointestinal bleed. 2. Coronary artery disease, status post coronary artery bypass graft     x2 in the past. 3. Hypertension. 4. Diabetes mellitus. 5. History of TIA in the past. 6. Hyperlipidemia. 7. Chronic anemia. 8. Degenerative joint disease.  DISCHARGE DIAGNOSES: 1. Acute blood-loss anemia on chronic anemia secondary to lower     gastrointestinal bleed. 2. Coronary artery disease, history of coronary artery bypass graft x2     in the past. 3. Hypertension. 4. Diabetes mellitus. 5. History of TIA. 6. Hyperlipidemia. 7. Chronic anemia. 8. Degenerative joint disease.  DISCHARGE HOME MEDICATIONS: 1. Allopurinol 300 mg half-tablet daily. 2. Amlodipine 5 mg daily. 3. Aspirin 81 mg daily. 4. Glipizide 10 mg daily. 5. Isosorbide mononitrate 60 mg daily. 6. Metformin 500 mg twice daily. 7. Metoprolol tartrate 50 mg half-tablet twice daily. 8. Nitroglycerin 0.4 mg daily. 9. Ramipril 10 mg daily. 10.Simvastatin 40 mg daily.  DIET:  Low-salt, low-cholesterol 1800 calories ADA diet.  The patient has been advised to monitor blood pressure and blood sugar daily and chart.  The patient has been advised to stop Zofran, Phenergan, and Ultram.  CONDITION AT DISCHARGE:  Stable.  FOLLOWUP:  With me in 1 week.  Follow up with GI in 1-2 weeks as scheduled.  BRIEF HISTORY AND HOSPITAL COURSE:  Ms. [name] is an 82 year old female with prior history of stroke, CABG, hypertension, hyperlipidemia came to the ER for evaluation of rectal bleeding.  Patient report she had sudden onset of rectal bleeding today x2 with  loose stools.  She reports bright red blood in the toilet.  Denies any fever, chills, nausea, vomiting, abdominal pain, chest pain, or shortness of breath.  She does report dizziness throughout the day, takes aspirin 81 mg, but no other anticoagulants.  Last colonoscopy was 2014, for colorectal cancer screening was negative in ER.  Stool positive for occult blood and hemoglobin of 8.3.  PAST MEDICAL HISTORY:  As above.  PHYSICAL EXAMINATION:  VITAL SIGNS:  Her blood pressure was 135/86, pulse is 89.  She was afebrile. HEENT:  Conjunctivae were pale.  Pupils were equal, round, reactive to light. NECK:  Supple.  No JVD.  No bruit. LUNGS:  Clear to auscultation without rhonchi or rales. CARDIOVASCULAR:  S1, S2 was normal.  There was 2/6 systolic murmur. ABDOMEN:  Soft.  There was diffuse mild abdominal tenderness.  No guarding. EXTREMITIES:  There is no clubbing, cyanosis, or edema. NEUROLOGIC:  Grossly intact.  LABORATORY DATA:  Her labs, sodium was 141, potassium 3.7, BUN 18, creatinine 1.88.  Glucose was 104.  Lactic acid was 2.27, hemoglobin was 8.3, hematocrit 26.9, repeat hemoglobin was 7.9, hematocrit 26.1. Repeat hemoglobin today 9.1, hematocrit 29.5, white count of 5.3.  Today labs, sodium 141, potassium 4.3, BUN 10, creatinine 1.46.  Stool for fecal occult blood was  positive.  BRIEF HOSPITAL COURSE:  The patient was admitted to telemetry unit.  GI consultation was called.  The patient did not have any further episodes of lower gastrointestinal bleed.  Her hemoglobin remained stable, in fact it improved.  The patient did not require any packed RBC transfusion during the hospital stay.  The patient will be followed up closely by GI as outpatient.  The patient will be discharged home on above medications and will be followed up in my office also in 1 week.     Allegra Lai. Terrence Dupont, M.D.     MNH/MEDQ  D:  10/10/2015  T:  10/10/2015  Job:  [id number]

## 2015-10-10 NOTE — Clinical Documentation Improvement (Signed)
Cardiology Gastroenterology  Can the diagnosis of anemia be further specified?   Acute blood loss anemia on chronic anemia  Chronic anemia only (as currently documented)  Other  Clinically Undetermined  Supporting Information: Current documentation reflects prior hgb 10.5 in 2014.  Pt admitted with hematochezia.   Component      RBC Hemoglobin HCT  Latest Ref Rng      3.87 - 5.11 MIL/uL 12.0 - 15.0 g/dL 36.0 - 46.0 %  10/08/2015     6:19 PM 3.94 8.3 (L) 26.9 (L)  10/09/2015     5:36 AM 3.78 (L) 7.9 (L) 26.1 (L)    Please exercise your independent, professional judgment when responding. A specific answer is not anticipated or expected.Please update your documentation within the medical record to reflect your response to this query.  Thank you, Mateo Flow, RN [phone] Clinical Documentation Specialist

## 2016-01-11 ENCOUNTER — Other Ambulatory Visit: Payer: Self-pay

## 2016-01-11 NOTE — Patient Outreach (Signed)
[facility]) Care Management  01/11/2016  [name] 01/26/1931 [id number]  Telephone call to patient for screening as patient on vitreos list.  Patient reports that she has Dr. Terrence Dupont as her primary care physician and sees every three months.  Patient admits to diabetes and last reading was 125.  Patient agreeable for telephonic outreach.  However, patient has non-[facility] doctor as primary.  Advised patient on this and is agreeable for [facility] telephonic program for assistance with disease management.    Plan: RN Health Coach will do referral to [employer] special needs population program. [place] will notify care management assistant to close case as patient has non-[facility] doctor and patient going to another care management program.  Jone Baseman, RN, MSN [place] [phone]

## 2016-10-09 ENCOUNTER — Other Ambulatory Visit (HOSPITAL_COMMUNITY): Payer: Self-pay | Admitting: Specialist

## 2016-10-10 ENCOUNTER — Other Ambulatory Visit (HOSPITAL_COMMUNITY): Payer: Self-pay | Admitting: Specialist

## 2016-10-10 DIAGNOSIS — R946 Abnormal results of thyroid function studies: Secondary | ICD-10-CM

## 2016-12-24 ENCOUNTER — Encounter (HOSPITAL_COMMUNITY): Payer: Self-pay

## 2016-12-24 ENCOUNTER — Emergency Department (HOSPITAL_COMMUNITY): Payer: Medicare HMO

## 2016-12-24 ENCOUNTER — Emergency Department (HOSPITAL_COMMUNITY)
Admission: EM | Admit: 2016-12-24 | Discharge: 2016-12-24 | Disposition: A | Discharge status: Home / Self Care | Payer: Medicare HMO | Attending: Emergency Medicine | Admitting: Emergency Medicine

## 2016-12-24 DIAGNOSIS — Z8673 Personal history of transient ischemic attack (TIA), and cerebral infarction without residual deficits: Secondary | ICD-10-CM | POA: Diagnosis not present

## 2016-12-24 DIAGNOSIS — I2510 Atherosclerotic heart disease of native coronary artery without angina pectoris: Secondary | ICD-10-CM | POA: Insufficient documentation

## 2016-12-24 DIAGNOSIS — I10 Essential (primary) hypertension: Secondary | ICD-10-CM | POA: Insufficient documentation

## 2016-12-24 DIAGNOSIS — Z87891 Personal history of nicotine dependence: Secondary | ICD-10-CM | POA: Diagnosis not present

## 2016-12-24 DIAGNOSIS — Z955 Presence of coronary angioplasty implant and graft: Secondary | ICD-10-CM | POA: Diagnosis not present

## 2016-12-24 DIAGNOSIS — Z79899 Other long term (current) drug therapy: Secondary | ICD-10-CM | POA: Insufficient documentation

## 2016-12-24 DIAGNOSIS — E119 Type 2 diabetes mellitus without complications: Secondary | ICD-10-CM | POA: Insufficient documentation

## 2016-12-24 DIAGNOSIS — Z951 Presence of aortocoronary bypass graft: Secondary | ICD-10-CM | POA: Insufficient documentation

## 2016-12-24 DIAGNOSIS — F410 Panic disorder [episodic paroxysmal anxiety] without agoraphobia: Secondary | ICD-10-CM | POA: Insufficient documentation

## 2016-12-24 DIAGNOSIS — Z7982 Long term (current) use of aspirin: Secondary | ICD-10-CM | POA: Diagnosis not present

## 2016-12-24 DIAGNOSIS — Z7984 Long term (current) use of oral hypoglycemic drugs: Secondary | ICD-10-CM | POA: Insufficient documentation

## 2016-12-24 MED ORDER — ASPIRIN 81 MG PO CHEW
324.0000 mg | CHEWABLE_TABLET | Freq: Once | ORAL | Status: AC
  Administered 2016-12-24: 324 mg via ORAL
  Filled 2016-12-24: qty 4

## 2016-12-24 MED ORDER — LORAZEPAM 0.5 MG PO TABS
0.5000 mg | ORAL_TABLET | Freq: Once | ORAL | Status: AC
  Administered 2016-12-24: 0.5 mg via ORAL
  Filled 2016-12-24: qty 1

## 2016-12-24 NOTE — ED Triage Notes (Signed)
Her daughter, who lives with pt. States pt. Awoke being "real anxious". Pt. Is tearful and in no distress and repeatedly asks for Dr. Terrence Dupont.

## 2016-12-24 NOTE — ED Notes (Signed)
She has just independently ambulated to anf from our b.r. For the second time without difficulty.

## 2016-12-24 NOTE — ED Provider Notes (Signed)
[facility] DEPT Provider Note   CSN: [id number] Arrival date & time: 12/24/16  2542     History   Chief Complaint Chief Complaint  Patient presents with  . Panic Attack    HPI [name] is a 83 y.o. female.  HPI Patient presents to the emergency room for evaluation of a possible anxiety attack. Patient woke up this morning and she says that she felt as if she was going to die.  Patient states she can't really explain what was going on. She felt like she couldn't move and she had pain all over her body. She was anxious and tearful according to patient's daughter. While in the emergency room the patient's symptoms have resolved. Patient states she feels fine now and is ready to go home. She is not sure what happened to her. She's never had this happen before. She denies any trouble with any chest pain or shortness of breath when this was occurring. She did have one episode of vomiting during the episode but denies any nausea now and is not having any pain. Past Medical History:  Diagnosis Date  . Coronary artery disease   . Diabetes mellitus without complication ([place])    TYPE 2   . GI bleeding 09/2015  . Hyperlipidemia   . Hypertension   . Stroke [facility])     Patient Active Problem List   Diagnosis Date Noted  . Acute lower GI bleeding 10/08/2015    Past Surgical History:  Procedure Laterality Date  . ABDOMINAL HYSTERECTOMY    . CAPSULOTOMY  05/11/2012   Procedure: MINOR CAPSULOTOMY;  Surgeon: Myrtha Mantis., MD;  Location: [facility] ENDOSCOPY;  Service: Ophthalmology;  Laterality: Left;  Yag capsulotomy  . COLONOSCOPY N/A 07/11/2013   Procedure: COLONOSCOPY;  Surgeon: Juanita Craver, MD;  Location: [facility] ENDOSCOPY;  Service: Endoscopy;  Laterality: N/A;  . CORONARY ANGIOPLASTY WITH STENT PLACEMENT    . CORONARY ARTERY BYPASS GRAFT    . ESOPHAGOGASTRODUODENOSCOPY N/A 07/11/2013   Procedure: ESOPHAGOGASTRODUODENOSCOPY (EGD);  Surgeon: Juanita Craver, MD;  Location: [facility]  ENDOSCOPY;  Service: Endoscopy;  Laterality: N/A;  . LEFT HEART CATHETERIZATION WITH CORONARY/GRAFT ANGIOGRAM N/A 12/09/2012   Procedure: LEFT HEART CATHETERIZATION WITH CORONARY/GRAFT ANGIOGRAM;  Surgeon: Clent Demark, MD;  Location: [facility] CATH LAB;  Service: Cardiovascular;  Laterality: N/A;    OB History    No data available       Home Medications    Prior to Admission medications   Medication Sig Start Date End Date Taking? Authorizing Provider  allopurinol (ZYLOPRIM) 300 MG tablet Take 150 mg by mouth daily.    Historical Provider, MD  amLODipine (NORVASC) 5 MG tablet Take 5 mg by mouth daily.    Historical Provider, MD  aspirin 81 MG tablet Take 81 mg by mouth daily as needed for pain.    Historical Provider, MD  glipiZIDE (GLUCOTROL XL) 10 MG 24 hr tablet Take 10 mg by mouth daily.    Historical Provider, MD  isosorbide mononitrate (IMDUR) 30 MG 24 hr tablet Take 60 mg by mouth daily.    Historical Provider, MD  metFORMIN (GLUCOPHAGE) 500 MG tablet Take 500 mg by mouth 2 (two) times daily with a meal.     Historical Provider, MD  metoprolol (LOPRESSOR) 50 MG tablet Take 50 mg by mouth daily.    Historical Provider, MD  nitroGLYCERIN (NITROSTAT) 0.4 MG SL tablet Place 1 tablet (0.4 mg total) under the tongue every 5 (five) minutes as needed for chest  pain. 12/09/12   Charolette Forward, MD  ramipril (ALTACE) 10 MG capsule Take 10 mg by mouth daily.    Historical Provider, MD  simvastatin (ZOCOR) 40 MG tablet Take 40 mg by mouth daily.    Historical Provider, MD    Family History No family history on file.  Social History Social History  Substance Use Topics  . Smoking status: Former Research scientist (life sciences)  . Smokeless tobacco: Never Used     Comment: quit smoking many years ago  . Alcohol use No     Allergies   Patient has no known allergies.   Review of Systems Review of Systems  All other systems reviewed and are negative.    Physical Exam Updated Vital Signs BP (!) 172/83 (BP  Location: Left Arm)   Temp 98 F (36.7 C)   Resp 20   SpO2 100%   Physical Exam  Constitutional: No distress.  Elderly   HENT:  Head: Normocephalic and atraumatic.  Right Ear: External ear normal.  Left Ear: External ear normal.  Eyes: Conjunctivae are normal. Right eye exhibits no discharge. Left eye exhibits no discharge. No scleral icterus.  Neck: Neck supple. No tracheal deviation present.  Cardiovascular: Normal rate, regular rhythm and intact distal pulses.   Pulmonary/Chest: Effort normal and breath sounds normal. No stridor. No respiratory distress. She has no wheezes. She has no rales.  Abdominal: Soft. Bowel sounds are normal. She exhibits no distension. There is no tenderness. There is no rebound and no guarding.  Musculoskeletal: She exhibits no edema or tenderness.  Neurological: She is alert. She has normal strength. No cranial nerve deficit (no facial droop, extraocular movements intact, no slurred speech) or sensory deficit. She exhibits normal muscle tone. She displays no seizure activity. Coordination normal.  Skin: Skin is warm and dry. No rash noted. She is not diaphoretic.  Psychiatric: She has a normal mood and affect.  Nursing note and vitals reviewed.    ED Treatments / Results  Labs (all labs ordered are listed, but only abnormal results are displayed) Labs Reviewed  [place], ED    EKG  EKG Interpretation  Date/Time:  Wednesday December 24 2016 09:14:37 EDT Ventricular Rate:  72 PR Interval:    QRS Duration: 135 QT Interval:  442 QTC Calculation: 484 R Axis:   79 Text Interpretation:  Sinus rhythm Right bundle branch block No significant change since last tracing Confirmed by Zakaria Fromer  MD-J, Shreyan Hinz ([id number]) on 12/24/2016 9:56:42 AM       Radiology Dg Chest 2 View  Result Date: 12/24/2016 CLINICAL DATA:  Chest pain EXAM: CHEST  2 VIEW COMPARISON:  December 07, 2012 FINDINGS: There is no edema or consolidation.  Heart size and pulmonary vascularity are normal. Patient is status post coronary artery bypass grafting. There is aortic atherosclerosis. There are multiple metallic fragments in the left upper thorax. There is degenerative change in the thoracic spine. No pneumothorax. IMPRESSION: No edema or consolidation. Stable cardiac silhouette. There is aortic atherosclerosis. Electronically Signed   By: Lowella Grip III M.D.   On: 12/24/2016 09:34    Procedures Procedures (including critical care time)  Medications Ordered in ED Medications  aspirin chewable tablet 324 mg (324 mg Oral Given 12/24/16 0946)  LORazepam (ATIVAN) tablet 0.5 mg (0.5 mg Oral Given 12/24/16 1009)     Initial Impression / Assessment and Plan / ED Course  I have reviewed the triage vital signs and the nursing notes.  Pertinent  labs & imaging results that were available during my care of the patient were reviewed by me and considered in my medical decision making (see chart for details).   patient presented to the emergency room with symptoms that sound like an anxiety attack.   Patient states her symptoms improved before my evaluation. She was not having any pain. EKG and chest x-ray were reassuring.  Initially planned on doing blood tests but each time phlebotomy attempted to draw blood, the patient started become anxious and pulledher arm away. SHe was unable to sit still to have any blood drawn. At this point I doubt that she has any significant emergency medical condition. I do not think she needs to be sedated just so that we can draw blood. Patient continues to feel well. I recommend she follow up with her doctor later this week. She should return immediately should she have any recurrent symptoms. Patient and her daughter are in agreement with this plan  Final Clinical Impressions(s) / ED Diagnoses   Final diagnoses:  Panic attack    New Prescriptions New Prescriptions   No medications on file     Dorie Rank,  MD 12/24/16 1135

## 2016-12-24 NOTE — ED Notes (Signed)
Despite our numerous entreaties, pt. Remains utterly unable to remain still enough to attempt either IV or phlebotomy. She is, overall, however much more calm and is visiting with her daughter.

## 2016-12-24 NOTE — ED Notes (Signed)
MD stated to not get labs.

## 2016-12-24 NOTE — Discharge Instructions (Signed)
Follow-up with your doctor later this week or early next week, return to start having any recurrent symptoms, chest pain shortness of breath or other concerns

## 2016-12-24 NOTE — ED Notes (Signed)
PATIENT WOULD NOT HOLD STILL TO OBTAIN LABS. RN NOTIFIED AND SAID TO NOT PROCEED WITH THE LABS.

## 2016-12-24 NOTE — ED Notes (Signed)
I witnessed an attempted phlebotomy. Pt. Is very jittery and seems unable to allow herself to cooperate with blood draw. Blood draw deferred and Dr. Tomi Bamberger notified.

## 2017-01-19 ENCOUNTER — Other Ambulatory Visit (HOSPITAL_COMMUNITY): Payer: Self-pay | Admitting: Specialist

## 2017-01-19 DIAGNOSIS — R7989 Other specified abnormal findings of blood chemistry: Secondary | ICD-10-CM

## 2017-01-29 ENCOUNTER — Encounter (HOSPITAL_COMMUNITY)
Admission: RE | Admit: 2017-01-29 | Discharge: 2017-01-29 | Disposition: A | Discharge status: Home / Self Care | Payer: Medicare HMO | Source: Ambulatory Visit | Attending: Specialist | Admitting: Specialist

## 2017-01-29 DIAGNOSIS — R7989 Other specified abnormal findings of blood chemistry: Secondary | ICD-10-CM

## 2017-01-29 DIAGNOSIS — R946 Abnormal results of thyroid function studies: Secondary | ICD-10-CM | POA: Insufficient documentation

## 2017-01-29 MED ORDER — SODIUM IODIDE I 131 CAPSULE
6.3000 | Freq: Once | INTRAVENOUS | Status: AC | PRN
Start: 2017-01-29 — End: 2017-01-29
  Administered 2017-01-29: 6.9 via ORAL

## 2017-01-30 ENCOUNTER — Encounter (HOSPITAL_COMMUNITY)
Admission: RE | Admit: 2017-01-30 | Discharge: 2017-01-30 | Disposition: A | Discharge status: Home / Self Care | Payer: Medicare HMO | Source: Ambulatory Visit | Attending: Specialist | Admitting: Specialist

## 2017-01-30 DIAGNOSIS — R946 Abnormal results of thyroid function studies: Secondary | ICD-10-CM | POA: Diagnosis not present

## 2017-01-30 MED ORDER — SODIUM PERTECHNETATE TC 99M INJECTION
10.2600 | Freq: Once | INTRAVENOUS | Status: AC | PRN
  Administered 2017-01-30: 10.26 via INTRAVENOUS

## 2017-02-19 ENCOUNTER — Observation Stay (HOSPITAL_COMMUNITY): Payer: Medicare HMO

## 2017-02-19 ENCOUNTER — Encounter (HOSPITAL_COMMUNITY): Payer: Self-pay | Admitting: *Deleted

## 2017-02-19 ENCOUNTER — Observation Stay (HOSPITAL_COMMUNITY)
Admission: EM | Admit: 2017-02-19 | Discharge: 2017-02-20 | Disposition: A | Discharge status: Home / Self Care | Payer: Medicare HMO | Attending: Internal Medicine | Admitting: Internal Medicine

## 2017-02-19 DIAGNOSIS — E1122 Type 2 diabetes mellitus with diabetic chronic kidney disease: Secondary | ICD-10-CM | POA: Insufficient documentation

## 2017-02-19 DIAGNOSIS — N183 Chronic kidney disease, stage 3 unspecified: Secondary | ICD-10-CM | POA: Diagnosis present

## 2017-02-19 DIAGNOSIS — I2510 Atherosclerotic heart disease of native coronary artery without angina pectoris: Secondary | ICD-10-CM | POA: Diagnosis not present

## 2017-02-19 DIAGNOSIS — Z7982 Long term (current) use of aspirin: Secondary | ICD-10-CM | POA: Insufficient documentation

## 2017-02-19 DIAGNOSIS — Z7984 Long term (current) use of oral hypoglycemic drugs: Secondary | ICD-10-CM | POA: Diagnosis not present

## 2017-02-19 DIAGNOSIS — Z8673 Personal history of transient ischemic attack (TIA), and cerebral infarction without residual deficits: Secondary | ICD-10-CM | POA: Diagnosis not present

## 2017-02-19 DIAGNOSIS — E11649 Type 2 diabetes mellitus with hypoglycemia without coma: Secondary | ICD-10-CM | POA: Diagnosis not present

## 2017-02-19 DIAGNOSIS — R1084 Generalized abdominal pain: Secondary | ICD-10-CM | POA: Diagnosis not present

## 2017-02-19 DIAGNOSIS — B961 Klebsiella pneumoniae [K. pneumoniae] as the cause of diseases classified elsewhere: Secondary | ICD-10-CM | POA: Diagnosis not present

## 2017-02-19 DIAGNOSIS — N179 Acute kidney failure, unspecified: Secondary | ICD-10-CM | POA: Diagnosis not present

## 2017-02-19 DIAGNOSIS — Z79899 Other long term (current) drug therapy: Secondary | ICD-10-CM | POA: Diagnosis not present

## 2017-02-19 DIAGNOSIS — G43A0 Cyclical vomiting, not intractable: Secondary | ICD-10-CM | POA: Diagnosis not present

## 2017-02-19 DIAGNOSIS — N390 Urinary tract infection, site not specified: Secondary | ICD-10-CM

## 2017-02-19 DIAGNOSIS — R112 Nausea with vomiting, unspecified: Secondary | ICD-10-CM | POA: Diagnosis present

## 2017-02-19 DIAGNOSIS — I10 Essential (primary) hypertension: Secondary | ICD-10-CM | POA: Diagnosis not present

## 2017-02-19 DIAGNOSIS — R109 Unspecified abdominal pain: Secondary | ICD-10-CM | POA: Diagnosis present

## 2017-02-19 DIAGNOSIS — E162 Hypoglycemia, unspecified: Secondary | ICD-10-CM | POA: Diagnosis present

## 2017-02-19 DIAGNOSIS — I129 Hypertensive chronic kidney disease with stage 1 through stage 4 chronic kidney disease, or unspecified chronic kidney disease: Secondary | ICD-10-CM | POA: Diagnosis not present

## 2017-02-19 DIAGNOSIS — B9689 Other specified bacterial agents as the cause of diseases classified elsewhere: Secondary | ICD-10-CM | POA: Diagnosis present

## 2017-02-19 DIAGNOSIS — E118 Type 2 diabetes mellitus with unspecified complications: Secondary | ICD-10-CM | POA: Diagnosis not present

## 2017-02-19 DIAGNOSIS — Z955 Presence of coronary angioplasty implant and graft: Secondary | ICD-10-CM | POA: Insufficient documentation

## 2017-02-19 DIAGNOSIS — Z87891 Personal history of nicotine dependence: Secondary | ICD-10-CM | POA: Diagnosis not present

## 2017-02-19 DIAGNOSIS — F329 Major depressive disorder, single episode, unspecified: Secondary | ICD-10-CM | POA: Insufficient documentation

## 2017-02-19 DIAGNOSIS — E785 Hyperlipidemia, unspecified: Secondary | ICD-10-CM | POA: Diagnosis not present

## 2017-02-19 DIAGNOSIS — D631 Anemia in chronic kidney disease: Secondary | ICD-10-CM | POA: Diagnosis not present

## 2017-02-19 LAB — URINALYSIS, ROUTINE W REFLEX MICROSCOPIC
Bilirubin Urine: NEGATIVE
GLUCOSE, UA: NEGATIVE mg/dL
Ketones, ur: NEGATIVE mg/dL
NITRITE: POSITIVE — AB
PROTEIN: NEGATIVE mg/dL
Specific Gravity, Urine: 1.006 (ref 1.005–1.030)
pH: 7 (ref 5.0–8.0)

## 2017-02-19 LAB — BASIC METABOLIC PANEL
Anion gap: 9 (ref 5–15)
BUN: 18 mg/dL (ref 6–20)
CALCIUM: 8.9 mg/dL (ref 8.9–10.3)
CHLORIDE: 111 mmol/L (ref 101–111)
CO2: 22 mmol/L (ref 22–32)
CREATININE: 1.82 mg/dL — AB (ref 0.44–1.00)
GFR calc Af Amer: 28 mL/min — ABNORMAL LOW (ref >60)
GFR, EST NON AFRICAN AMERICAN: 24 mL/min — AB (ref >60)
Glucose, Bld: 99 mg/dL (ref 65–99)
Potassium: 4.7 mmol/L (ref 3.5–5.1)
SODIUM: 142 mmol/L (ref 135–145)

## 2017-02-19 LAB — CBG MONITORING, ED
GLUCOSE-CAPILLARY: 66 mg/dL (ref 65–99)
Glucose-Capillary: 34 mg/dL — CL (ref 65–99)
Glucose-Capillary: 104 mg/dL — ABNORMAL HIGH (ref 65–99)
Glucose-Capillary: 52 mg/dL — ABNORMAL LOW (ref 65–99)
Glucose-Capillary: 71 mg/dL (ref 65–99)

## 2017-02-19 LAB — COMPREHENSIVE METABOLIC PANEL
ALK PHOS: 89 U/L (ref 38–126)
ALT: 10 U/L — AB (ref 14–54)
AST: 29 U/L (ref 15–41)
Albumin: 3.5 g/dL (ref 3.5–5.0)
Anion gap: 9 (ref 5–15)
BUN: 20 mg/dL (ref 6–20)
CO2: 23 mmol/L (ref 22–32)
CREATININE: 1.88 mg/dL — AB (ref 0.44–1.00)
Calcium: 9.1 mg/dL (ref 8.9–10.3)
Chloride: 109 mmol/L (ref 101–111)
GFR calc non Af Amer: 23 mL/min — ABNORMAL LOW (ref >60)
GFR, EST AFRICAN AMERICAN: 27 mL/min — AB (ref >60)
Glucose, Bld: 36 mg/dL — CL (ref 65–99)
Potassium: 4.2 mmol/L (ref 3.5–5.1)
Sodium: 141 mmol/L (ref 135–145)
Total Bilirubin: 0.4 mg/dL (ref 0.3–1.2)
Total Protein: 8.6 g/dL — ABNORMAL HIGH (ref 6.5–8.1)

## 2017-02-19 LAB — CBC
HCT: 31.4 % — ABNORMAL LOW (ref 36.0–46.0)
Hemoglobin: 9.7 g/dL — ABNORMAL LOW (ref 12.0–15.0)
MCH: 21.1 pg — ABNORMAL LOW (ref 26.0–34.0)
MCHC: 30.9 g/dL (ref 30.0–36.0)
MCV: 68.3 fL — ABNORMAL LOW (ref 78.0–100.0)
PLATELETS: 170 10*3/uL (ref 150–400)
RBC: 4.6 MIL/uL (ref 3.87–5.11)
RDW: 15.5 % (ref 11.5–15.5)
WBC: 6 10*3/uL (ref 4.0–10.5)

## 2017-02-19 LAB — LACTIC ACID, PLASMA: LACTIC ACID, VENOUS: 2 mmol/L — AB (ref 0.5–1.9)

## 2017-02-19 LAB — GLUCOSE, CAPILLARY
GLUCOSE-CAPILLARY: 107 mg/dL — AB (ref 65–99)
GLUCOSE-CAPILLARY: 100 mg/dL — AB (ref 65–99)
Glucose-Capillary: 117 mg/dL — ABNORMAL HIGH (ref 65–99)

## 2017-02-19 LAB — LIPASE, BLOOD: LIPASE: 49 U/L (ref 11–51)

## 2017-02-19 MED ORDER — ONDANSETRON HCL 4 MG/2ML IJ SOLN
4.0000 mg | Freq: Once | INTRAMUSCULAR | Status: AC
  Administered 2017-02-19: 4 mg via INTRAVENOUS
  Filled 2017-02-19: qty 2

## 2017-02-19 MED ORDER — DEXTROSE 5 % IV SOLN
1.0000 g | Freq: Once | INTRAVENOUS | Status: AC
  Administered 2017-02-19: 1 g via INTRAVENOUS
  Filled 2017-02-19: qty 10

## 2017-02-19 MED ORDER — ALLOPURINOL 150 MG HALF TABLET
150.0000 mg | ORAL_TABLET | Freq: Every day | ORAL | Status: DC
  Administered 2017-02-19 – 2017-02-20 (×2): 150 mg via ORAL
  Filled 2017-02-19 (×2): qty 1

## 2017-02-19 MED ORDER — ESCITALOPRAM OXALATE 10 MG PO TABS
10.0000 mg | ORAL_TABLET | Freq: Every day | ORAL | Status: DC
  Administered 2017-02-20: 10 mg via ORAL
  Filled 2017-02-19: qty 1

## 2017-02-19 MED ORDER — METOPROLOL SUCCINATE ER 50 MG PO TB24
50.0000 mg | ORAL_TABLET | Freq: Every day | ORAL | Status: DC
  Administered 2017-02-19: 50 mg via ORAL
  Filled 2017-02-19: qty 1

## 2017-02-19 MED ORDER — ONDANSETRON HCL 4 MG/2ML IJ SOLN: 4.0000 mg | Freq: Four times a day (QID) | INTRAMUSCULAR | Status: DC | PRN

## 2017-02-19 MED ORDER — ASPIRIN 81 MG PO TABS: 81.0000 mg | ORAL_TABLET | Freq: Every day | ORAL | Status: DC | PRN

## 2017-02-19 MED ORDER — SODIUM CHLORIDE 0.9 % IV SOLN
INTRAVENOUS | Status: DC
  Administered 2017-02-19 – 2017-02-20 (×3): via INTRAVENOUS

## 2017-02-19 MED ORDER — ACETAMINOPHEN 325 MG PO TABS
325.0000 mg | ORAL_TABLET | Freq: Once | ORAL | Status: DC
  Filled 2017-02-19: qty 1

## 2017-02-19 MED ORDER — CEPHALEXIN 500 MG PO CAPS: 500.0000 mg | ORAL_CAPSULE | Freq: Three times a day (TID) | ORAL | 0 refills | Status: DC

## 2017-02-19 MED ORDER — DEXTROSE 5 % IV SOLN
1.0000 g | INTRAVENOUS | Status: DC
  Filled 2017-02-19: qty 10

## 2017-02-19 MED ORDER — ACETAMINOPHEN 650 MG RE SUPP: 650.0000 mg | Freq: Four times a day (QID) | RECTAL | Status: DC | PRN

## 2017-02-19 MED ORDER — ACETAMINOPHEN 325 MG PO TABS: 650.0000 mg | ORAL_TABLET | Freq: Four times a day (QID) | ORAL | Status: DC | PRN

## 2017-02-19 MED ORDER — INSULIN ASPART 100 UNIT/ML ~~LOC~~ SOLN: 0.0000 [IU] | Freq: Every day | SUBCUTANEOUS | Status: DC

## 2017-02-19 MED ORDER — ONDANSETRON HCL 4 MG PO TABS: 4.0000 mg | ORAL_TABLET | Freq: Three times a day (TID) | ORAL | 0 refills | Status: DC | PRN

## 2017-02-19 MED ORDER — DEXTROSE 50 % IV SOLN
50.0000 mL | Freq: Once | INTRAVENOUS | Status: AC
  Administered 2017-02-19: 50 mL via INTRAVENOUS

## 2017-02-19 MED ORDER — SODIUM CHLORIDE 0.9 % IV BOLUS (SEPSIS)
1000.0000 mL | Freq: Once | INTRAVENOUS | Status: AC
  Administered 2017-02-19: 1000 mL via INTRAVENOUS

## 2017-02-19 MED ORDER — RAMIPRIL 10 MG PO CAPS
10.0000 mg | ORAL_CAPSULE | Freq: Every day | ORAL | Status: DC
  Administered 2017-02-19: 10 mg via ORAL
  Filled 2017-02-19 (×2): qty 1

## 2017-02-19 MED ORDER — DEXTROSE 50 % IV SOLN
INTRAVENOUS | Status: AC
Start: 2017-02-19 — End: 2017-02-19
  Filled 2017-02-19: qty 50

## 2017-02-19 MED ORDER — ONDANSETRON HCL 4 MG PO TABS: 4.0000 mg | ORAL_TABLET | Freq: Four times a day (QID) | ORAL | Status: DC | PRN

## 2017-02-19 MED ORDER — AMLODIPINE BESYLATE 5 MG PO TABS
5.0000 mg | ORAL_TABLET | Freq: Every day | ORAL | Status: DC
  Administered 2017-02-20: 5 mg via ORAL
  Filled 2017-02-19: qty 1

## 2017-02-19 MED ORDER — ISOSORBIDE MONONITRATE ER 60 MG PO TB24
60.0000 mg | ORAL_TABLET | Freq: Every day | ORAL | Status: DC
  Administered 2017-02-20: 60 mg via ORAL
  Filled 2017-02-19: qty 1

## 2017-02-19 MED ORDER — HEPARIN SODIUM (PORCINE) 5000 UNIT/ML IJ SOLN
5000.0000 [IU] | Freq: Three times a day (TID) | INTRAMUSCULAR | Status: DC
  Administered 2017-02-19 – 2017-02-20 (×2): 5000 [IU] via SUBCUTANEOUS
  Filled 2017-02-19 (×2): qty 1

## 2017-02-19 MED ORDER — INSULIN ASPART 100 UNIT/ML ~~LOC~~ SOLN: 0.0000 [IU] | Freq: Three times a day (TID) | SUBCUTANEOUS | Status: DC

## 2017-02-19 MED ORDER — ACETAMINOPHEN 325 MG PO TABS
650.0000 mg | ORAL_TABLET | Freq: Once | ORAL | Status: AC
  Administered 2017-02-19: 650 mg via ORAL

## 2017-02-19 MED ORDER — ATORVASTATIN CALCIUM 20 MG PO TABS
20.0000 mg | ORAL_TABLET | Freq: Every day | ORAL | Status: DC
  Administered 2017-02-19: 20 mg via ORAL
  Filled 2017-02-19: qty 1

## 2017-02-19 MED ORDER — CEPHALEXIN 250 MG PO CAPS
500.0000 mg | ORAL_CAPSULE | Freq: Once | ORAL | Status: AC
  Administered 2017-02-19: 500 mg via ORAL
  Filled 2017-02-19: qty 2

## 2017-02-19 NOTE — ED Notes (Signed)
Pt ambulated self efficiently to restroom with no difficulty. 

## 2017-02-19 NOTE — ED Provider Notes (Signed)
Patient's very pleasant 83 year old female presenting with nausea and vomiting. Patient found to have persistently low glucose in the department. Patient initially had a glucose of 34. After the D50 bolus, she was at 100 and then dropped again to 60. It dropped to 60 in the setting of 2 orange juices. After an additional apple juice and sandwich and crackers and raised only to 70. Unclear what is causing patient's erratic sugars. She denies taking any diabetic medications. She is prescribed metformin and glipizide but she denies having taken in months. Given these erratic sugars in the setting of a urinary tract infection will admit for observation.   Macarthur Critchley, MD 02/19/17 [phone]

## 2017-02-19 NOTE — ED Notes (Addendum)
Report called to [address]

## 2017-02-19 NOTE — Progress Notes (Signed)
Patient arrived to 6[address]03 from ED,alert and oriented, IV to left AC saline locked, patient reports being comfortable in no pain. No visitors at the moment. VSS BP slightly soft, will continue to monitor. Patient oriented to room and staff.

## 2017-02-19 NOTE — ED Provider Notes (Addendum)
[place] DEPT Provider Note   CSN: [id number] Arrival date & time: 02/19/17  6767   By signing my name below, I, Neta Mends, attest that this documentation has been prepared under the direction and in the presence of Rolland Porter, MD . Electronically Signed: Neta Mends, ED Scribe. 02/19/2017. 3:12 AM.  Time seen: 2094  History   Chief Complaint Chief Complaint  Patient presents with  . Nausea  . Emesis   LEVEL 5 CAVEAT DUE TO AGE  The history is provided by the patient. No language interpreter was used.   HPI Comments:  [name] is a 83 y.o. female with PMHx of DM, CAD, HTN and stroke who presents to the Emergency Department complaining of ongoing nausea since 2200 yesterday, May 30. Pt complains of associated vomiting x 1, diaphoresis, chills. Denies any sick contacts, and denies eating any abnormal foods. Pt lives at home with her son and cooks at home. No alleviating factors noted. Denies diarrhea, abdominal pain.   PCP: Charolette Forward, MD    Past Medical History:  Diagnosis Date  . Coronary artery disease   . Diabetes mellitus without complication ([place])    TYPE 2   . GI bleeding 09/2015  . Hyperlipidemia   . Hypertension   . Stroke [facility])     Patient Active Problem List   Diagnosis Date Noted  . Acute lower GI bleeding 10/08/2015    Past Surgical History:  Procedure Laterality Date  . ABDOMINAL HYSTERECTOMY    . CAPSULOTOMY  05/11/2012   Procedure: MINOR CAPSULOTOMY;  Surgeon: Myrtha Mantis., MD;  Location: [facility] ENDOSCOPY;  Service: Ophthalmology;  Laterality: Left;  Yag capsulotomy  . COLONOSCOPY N/A 07/11/2013   Procedure: COLONOSCOPY;  Surgeon: Juanita Craver, MD;  Location: [facility] ENDOSCOPY;  Service: Endoscopy;  Laterality: N/A;  . CORONARY ANGIOPLASTY WITH STENT PLACEMENT    . CORONARY ARTERY BYPASS GRAFT    . ESOPHAGOGASTRODUODENOSCOPY N/A 07/11/2013   Procedure: ESOPHAGOGASTRODUODENOSCOPY (EGD);  Surgeon: Juanita Craver, MD;   Location: [facility] ENDOSCOPY;  Service: Endoscopy;  Laterality: N/A;  . LEFT HEART CATHETERIZATION WITH CORONARY/GRAFT ANGIOGRAM N/A 12/09/2012   Procedure: LEFT HEART CATHETERIZATION WITH CORONARY/GRAFT ANGIOGRAM;  Surgeon: Clent Demark, MD;  Location[facility] CATH LAB;  Service: Cardiovascular;  Laterality: N/A;    OB History    No data available       Home Medications    Prior to Admission medications   Medication Sig Start Date End Date Taking? Authorizing Provider  allopurinol (ZYLOPRIM) 300 MG tablet Take 150 mg by mouth daily.   Yes [provider]  amLODipine (NORVASC) 5 MG tablet Take 5 mg by mouth daily.   Yes [provider]  aspirin 81 MG tablet Take 81 mg by mouth daily as needed for pain.   Yes [provider]  atorvastatin (LIPITOR) 20 MG tablet Take 20 mg by mouth daily. 01/12/17  Yes [provider]  escitalopram (LEXAPRO) 10 MG tablet Take 10 mg by mouth daily. 02/08/17  Yes [provider]  glipiZIDE (GLUCOTROL XL) 10 MG 24 hr tablet Take 10 mg by mouth daily.   Yes [provider]  isosorbide mononitrate (IMDUR) 30 MG 24 hr tablet Take 60 mg by mouth daily.   Yes [provider]  metFORMIN (GLUCOPHAGE) 500 MG tablet Take 500 mg by mouth 2 (two) times daily with a meal.    Yes [provider]  metoprolol succinate (TOPROL-XL) 50 MG 24 hr tablet Take 50  mg by mouth daily. 12/29/16  Yes [provider]  nitroGLYCERIN (NITROSTAT) 0.4 MG SL tablet Place 1 tablet (0.4 mg total) under the tongue every 5 (five) minutes as needed for chest pain. 12/09/12  Yes Charolette Forward, MD  ramipril (ALTACE) 10 MG capsule Take 10 mg by mouth daily.   Yes [provider]  cephALEXin (KEFLEX) 500 MG capsule Take 1 capsule (500 mg total) by mouth 3 (three) times daily. 02/19/17   Rolland Porter, MD  ondansetron (ZOFRAN) 4 MG tablet Take 1 tablet (4 mg total) by mouth every 8 (eight) hours as needed for nausea or vomiting.  02/19/17   Rolland Porter, MD    Family History No family history on file.  Social History Social History  Substance Use Topics  . Smoking status: Former Research scientist (life sciences)  . Smokeless tobacco: Never Used     Comment: quit smoking many years ago  . Alcohol use No  lives with son   Allergies   Patient has no known allergies.   Review of Systems Review of Systems  Unable to perform ROS: Age  Gastrointestinal: Positive for nausea. Negative for abdominal pain and diarrhea.  Genitourinary: Negative for difficulty urinating and flank pain.  All other systems reviewed and are negative.    Physical Exam Updated Vital Signs BP (!) 162/70   Pulse 68   Resp 18   SpO2 100%   Vital signs normal except hypertension   Physical Exam  Constitutional: She is oriented to person, place, and time. She appears well-developed and well-nourished.  Non-toxic appearance. She does not appear ill. No distress.  HENT:  Head: Normocephalic and atraumatic.  Right Ear: External ear normal.  Left Ear: External ear normal.  Nose: Nose normal. No mucosal edema or rhinorrhea.  Mouth/Throat: Oropharynx is clear and moist and mucous membranes are normal. No dental abscesses or uvula swelling.  Eyes: Conjunctivae and EOM are normal. Pupils are equal, round, and reactive to light.  Neck: Normal range of motion and full passive range of motion without pain. Neck supple.  Cardiovascular: Normal rate, regular rhythm and normal heart sounds.  Exam reveals no gallop and no friction rub.   No murmur heard. Pulmonary/Chest: Effort normal and breath sounds normal. No respiratory distress. She has no wheezes. She has no rhonchi. She has no rales. She exhibits no tenderness and no crepitus.  Abdominal: Soft. Normal appearance and bowel sounds are normal. She exhibits no distension. There is tenderness (TTP to lower abdomen diffusely ). There is no rebound and no guarding.    She is only tender to palpation, she denies pain  otherwise.  Musculoskeletal: Normal range of motion. She exhibits no edema or tenderness.  Moves all extremities well.   Neurological: She is alert and oriented to person, place, and time. She has normal strength. No cranial nerve deficit.  Skin: Skin is warm, dry and intact. No rash noted. No erythema. No pallor.  Psychiatric: She has a normal mood and affect. Her speech is normal and behavior is normal. Her mood appears not anxious.  Nursing note and vitals reviewed.    ED Treatments / Results  Labs (all labs ordered are listed, but only abnormal results are displayed) Results for orders placed or performed during the hospital encounter of 02/19/17  Lipase, blood  Result Value Ref Range   Lipase 49 11 - 51 U/L  Comprehensive metabolic panel  Result Value Ref Range   Sodium 141 135 - 145 mmol/L   Potassium 4.2 3.5 -  5.1 mmol/L   Chloride 109 101 - 111 mmol/L   CO2 23 22 - 32 mmol/L   Glucose, Bld 36 (LL) 65 - 99 mg/dL   BUN 20 6 - 20 mg/dL   Creatinine, Ser 1.88 (H) 0.44 - 1.00 mg/dL   Calcium 9.1 8.9 - 10.3 mg/dL   Total Protein 8.6 (H) 6.5 - 8.1 g/dL   Albumin 3.5 3.5 - 5.0 g/dL   AST 29 15 - 41 U/L   ALT 10 (L) 14 - 54 U/L   Alkaline Phosphatase 89 38 - 126 U/L   Total Bilirubin 0.4 0.3 - 1.2 mg/dL   GFR calc non Af Amer 23 (L) >60 mL/min   GFR calc Af Amer 27 (L) >60 mL/min   Anion gap 9 5 - 15  CBC  Result Value Ref Range   WBC 6.0 4.0 - 10.5 K/uL   RBC 4.60 3.87 - 5.11 MIL/uL   Hemoglobin 9.7 (L) 12.0 - 15.0 g/dL   HCT 31.4 (L) 36.0 - 46.0 %   MCV 68.3 (L) 78.0 - 100.0 fL   MCH 21.1 (L) 26.0 - 34.0 pg   MCHC 30.9 30.0 - 36.0 g/dL   RDW 15.5 11.5 - 15.5 %   Platelets 170 150 - 400 K/uL  Urinalysis, Routine w reflex microscopic  Result Value Ref Range   Color, Urine YELLOW YELLOW   APPearance HAZY (A) CLEAR   Specific Gravity, Urine 1.006 1.005 - 1.030   pH 7.0 5.0 - 8.0   Glucose, UA NEGATIVE NEGATIVE mg/dL   Hgb urine dipstick SMALL (A) NEGATIVE    Bilirubin Urine NEGATIVE NEGATIVE   Ketones, ur NEGATIVE NEGATIVE mg/dL   Protein, ur NEGATIVE NEGATIVE mg/dL   Nitrite POSITIVE (A) NEGATIVE   Leukocytes, UA TRACE (A) NEGATIVE   RBC / HPF 0-5 0 - 5 RBC/hpf   WBC, UA 0-5 0 - 5 WBC/hpf   Bacteria, UA MANY (A) NONE SEEN   Squamous Epithelial / LPF 0-5 (A) NONE SEEN  CBG monitoring, ED  Result Value Ref Range   Glucose-Capillary 34 (LL) 65 - 99 mg/dL  CBG monitoring, ED  Result Value Ref Range   Glucose-Capillary 104 (H) 65 - 99 mg/dL  CBG monitoring, ED  Result Value Ref Range   Glucose-Capillary 52 (L) 65 - 99 mg/dL   Laboratory interpretation all normal except hypoglycemia, stable anemia, possible UTI, renal insufficiency , Possible UTI   EKG  EKG Interpretation None       Radiology No results found.  Procedures Procedures (including critical care time)  Medications Ordered in ED Medications  dextrose 50 % solution (not administered)  sodium chloride 0.9 % bolus 1,000 mL (1,000 mLs Intravenous New Bag/Given 02/19/17 0405)  ondansetron (ZOFRAN) injection 4 mg (4 mg Intravenous Given 02/19/17 0405)  dextrose 50 % solution 50 mL (50 mLs Intravenous Given 02/19/17 0552)  acetaminophen (TYLENOL) tablet 650 mg (650 mg Oral Given 02/19/17 0625)  cephALEXin (KEFLEX) capsule 500 mg (500 mg Oral Given 02/19/17 0740)     Initial Impression / Assessment and Plan / ED Course  I have reviewed the triage vital signs and the nursing notes.  Pertinent labs & imaging results that were available during my care of the patient were reviewed by me and considered in my medical decision making (see chart for details).  DIAGNOSTIC STUDIES:  Oxygen Saturation is 100% on RA, normal by my interpretation.    COORDINATION OF CARE:  3:09 AM Discussed treatment plan with pt at  bedside and pt agreed to plan.  Patient was given IV fluids and IV nausea medication.  5:30 AM patient noted to be hypoglycemic, she was given 1 amp of D50 and  encouraged to drink fluids.  Nurse reports however her IV infiltrated. Patient complains of pain in her arm. She was given ice pack which she refused because it made her feel cold. She was then given a heat pack.   Urine culture was sent, patient was given Keflex for possible UTI.  When I rechecked the patient at 7:50 AM she states she's feeling much better. She denies any nausea or vomiting currently or any abdominal pain. She does have some mild diffuse swelling of her right forearm where her IV infiltrated. She did not understand what happened and I explained to her that the IV fluid went into her soft tissue inset into the vein and that it would take a couple days for the swelling to go down. She seemed to understand.  CBG at 8:45 was 52, patient was awake and alert and actually ambulated to the bathroom. She was given something to eat. I will have the day shift doctor monitor her longer.  Final Clinical Impressions(s) / ED Diagnoses   Final diagnoses:  Non-intractable vomiting with nausea, unspecified vomiting type  Urinary tract infection without hematuria, site unspecified  Hypoglycemia    New Prescriptions New Prescriptions   CEPHALEXIN (KEFLEX) 500 MG CAPSULE    Take 1 capsule (500 mg total) by mouth 3 (three) times daily.   ONDANSETRON (ZOFRAN) 4 MG TABLET    Take 1 tablet (4 mg total) by mouth every 8 (eight) hours as needed for nausea or vomiting.   Plan discharge if she doesn't get hypoglycemic again.   Rolland Porter, MD, FACEP   I personally performed the services described in this documentation, which was scribed in my presence. The recorded information has been reviewed and considered.  Rolland Porter, MD, Barbette Or, MD 02/19/17 [place], [place], MD 02/22/17 [phone]

## 2017-02-19 NOTE — ED Notes (Signed)
cbg was 52

## 2017-02-19 NOTE — ED Triage Notes (Signed)
Pt to ED from EMS c/o NV and chills with diaphoresis onset at 10pm. Pt denies diarrhea.

## 2017-02-19 NOTE — ED Notes (Signed)
Pt ambulated to bathroom 

## 2017-02-19 NOTE — ED Notes (Addendum)
Pt stuck once for CBG. Unable to collect. Pt refusing to have blood sugar checked at this time.

## 2017-02-19 NOTE — Discharge Instructions (Signed)
Drink plenty of fluids (clear liquids) then start a bland diet later this morning such as toast, crackers, jello, [employer] chicken noodle soup. Use the zofran for nausea or vomiting. Take the antibiotic until gone.  Recheck if you get worse again. Monitor your blood sugar closely over the next 2 days.

## 2017-02-19 NOTE — ED Notes (Addendum)
Lab called to notify that CMP glucose was 36. CBG checked and it was 34. IV infiltrated and edema noted on RFA, so line d/c'd and new IV started in LAC and 1 amp D50 given, pt drinking apple juice. Warm pack placed on RFA. Will recheck CBG soon.

## 2017-02-19 NOTE — ED Notes (Signed)
Patient sleeping soundly when RN entered room.  

## 2017-02-19 NOTE — ED Notes (Signed)
Patient transported to X-ray 

## 2017-02-19 NOTE — Progress Notes (Signed)
CRITICAL VALUE STICKER  CRITICAL VALUE: Lactic Acid 2.0  RECEIVER (on-site recipient of call):Brandace Cargle,RN DATE & TIME NOTIFIED:   MESSENGER (representative from lab):  MD NOTIFIED: [place]  TIME OF NOTIFICATION:1530  RESPONSE: [place] stated it has gone down slightly from previous check.

## 2017-02-19 NOTE — ED Notes (Signed)
Patient had only drank 2 cups of orange juice, but had not drank the apple juice provided. Encouraged patient to drink apple juice after CBG checked - 66.

## 2017-02-19 NOTE — ED Notes (Signed)
Pt given apple juice and crackers.  

## 2017-02-19 NOTE — H&P (Signed)
History and Physical    [name] NFA:[id number] DOB: Dec 12, 1930 DOA: 02/19/2017  PCP: Charolette Forward, MD Patient coming from: home  Chief Complaint: n/v general ill feeling  HPI: [name] is a 83 y.o. female with medical history significant of CAD, diabetes, GI bleed, hyperlipidemia, hypertension, CVA. Patient presenting with gradual onset nausea vomiting diaphoresis and chills. Started approximately 22:00 on 02/18/2017. Patient denies eating any spoiled food or similarly sick contacts. Patient lives at home with her son. No alleviating or aggravating factors. Denies diarrhea, dysuria, flank pain, neck stiffness, headache, focal neurological deficit, syncope, chest pain, shortness breath, palpitations, fevers. Patient does endorse diffuse abdominal pain that is mild and intermittent, poor by mouth intake for the last 36-48 hours, and frequency.    ED Course: patient noted to be profoundly hypoglycemic in the emergency room with a glucose of 34. Administered D50 with improvement in glucose but then quickly dropped again to 60 with patient feeling symptomatic. Patient given multiple rounds of apple juice, orange juice, full lunch meal and crackers. Glucose only up to 70 at that point time.   Review of Systems: As per HPI otherwise all other systems reviewed and are negative  Ambulatory Status: no restrictions  Past Medical History:  Diagnosis Date  . Coronary artery disease   . Diabetes mellitus without complication ([place])    TYPE 2   . GI bleeding 09/2015  . Hyperlipidemia   . Hypertension   . Stroke [facility])     Past Surgical History:  Procedure Laterality Date  . ABDOMINAL HYSTERECTOMY    . CAPSULOTOMY  05/11/2012   Procedure: MINOR CAPSULOTOMY;  Surgeon: Myrtha Mantis., MD;  Location: [facility] ENDOSCOPY;  Service: Ophthalmology;  Laterality: Left;  Yag capsulotomy  . COLONOSCOPY N/A 07/11/2013   Procedure: COLONOSCOPY;  Surgeon: Juanita Craver, MD;  Location: [facility]  ENDOSCOPY;  Service: Endoscopy;  Laterality: N/A;  . CORONARY ANGIOPLASTY WITH STENT PLACEMENT    . CORONARY ARTERY BYPASS GRAFT    . ESOPHAGOGASTRODUODENOSCOPY N/A 07/11/2013   Procedure: ESOPHAGOGASTRODUODENOSCOPY (EGD);  Surgeon: Juanita Craver, MD;  Location: [facility] ENDOSCOPY;  Service: Endoscopy;  Laterality: N/A;  . LEFT HEART CATHETERIZATION WITH CORONARY/GRAFT ANGIOGRAM N/A 12/09/2012   Procedure: LEFT HEART CATHETERIZATION WITH CORONARY/GRAFT ANGIOGRAM;  Surgeon: Clent Demark, MD;  Location: [facility] CATH LAB;  Service: Cardiovascular;  Laterality: N/A;    Social History   Social History  . Marital status: Divorced    Spouse name: N/A  . Number of children: N/A  . Years of education: N/A   Occupational History  . Not on file.   Social History Main Topics  . Smoking status: Former Research scientist (life sciences)  . Smokeless tobacco: Never Used     Comment: quit smoking many years ago  . Alcohol use No  . Drug use: No  . Sexual activity: Not on file   Other Topics Concern  . Not on file   Social History Narrative  . No narrative on file    No Known Allergies  Family History  Problem Relation Age of Onset  . Family history unknown: Yes    Prior to Admission medications   Medication Sig Start Date End Date Taking? Authorizing Provider  allopurinol (ZYLOPRIM) 300 MG tablet Take 150 mg by mouth daily.   Yes [provider]  amLODipine (NORVASC) 5 MG tablet Take 5 mg by mouth daily.   Yes [provider]  aspirin 81 MG tablet Take 81 mg by mouth daily as needed for pain.  Yes [provider]  atorvastatin (LIPITOR) 20 MG tablet Take 20 mg by mouth daily. 01/12/17  Yes [provider]  escitalopram (LEXAPRO) 10 MG tablet Take 10 mg by mouth daily. 02/08/17  Yes [provider]  glipiZIDE (GLUCOTROL XL) 10 MG 24 hr tablet Take 10 mg by mouth daily.   Yes [provider]  isosorbide mononitrate (IMDUR) 30 MG 24 hr tablet Take 60 mg by mouth daily.    Yes [provider]  metFORMIN (GLUCOPHAGE) 500 MG tablet Take 500 mg by mouth 2 (two) times daily with a meal.    Yes [provider]  metoprolol succinate (TOPROL-XL) 50 MG 24 hr tablet Take 50 mg by mouth daily. 12/29/16  Yes [provider]  nitroGLYCERIN (NITROSTAT) 0.4 MG SL tablet Place 1 tablet (0.4 mg total) under the tongue every 5 (five) minutes as needed for chest pain. 12/09/12  Yes Charolette Forward, MD  ramipril (ALTACE) 10 MG capsule Take 10 mg by mouth daily.   Yes [provider]  cephALEXin (KEFLEX) 500 MG capsule Take 1 capsule (500 mg total) by mouth 3 (three) times daily. 02/19/17   Rolland Porter, MD  ondansetron (ZOFRAN) 4 MG tablet Take 1 tablet (4 mg total) by mouth every 8 (eight) hours as needed for nausea or vomiting. 02/19/17   Rolland Porter, MD    Physical Exam: Vitals:   02/19/17 1130 02/19/17 1200 02/19/17 1230 02/19/17 1245  BP: (!) 133/46 (!) 161/53 (!) 141/63 (!) 133/54  Pulse: 78 82 76 77  Resp:      SpO2: 100% 100% 100% 100%     General:  Appears calm and comfortable Eyes:  PERRL, EOMI, normal lids, iris ENT:  grossly normal hearing, lips & tongue, mmm Neck:  no LAD, masses or thyromegaly Cardiovascular:  RRR, no m/r/g. No LE edema.  Respiratory:  CTA bilaterally, no w/r/r. Normal respiratory effort. Abdomen: Mildly distended, tenderness to palpation only in the suprapubic region but also to the epigastrium. Normoactive bowel sounds Skin:  no rash or induration seen on limited exam Musculoskeletal:  grossly normal tone BUE/BLE, good ROM, no bony abnormality Psychiatric:  grossly normal mood and affect, speech fluent and appropriate, AOx3 Neurologic:  CN 2-12 grossly intact, moves all extremities in coordinated fashion, sensation intact  Labs on Admission: I have personally reviewed following labs and imaging studies  CBC:  Recent Labs Lab 02/19/17 0359  WBC 6.0  HGB 9.7*  HCT 31.4*  MCV 68.3*  PLT 798   Basic  Metabolic Panel:  Recent Labs Lab 02/19/17 0359  NA 141  K 4.2  CL 109  CO2 23  GLUCOSE 36*  BUN 20  CREATININE 1.88*  CALCIUM 9.1   GFR: CrCl cannot be calculated (Unknown ideal weight.). Liver Function Tests:  Recent Labs Lab 02/19/17 0359  AST 29  ALT 10*  ALKPHOS 89  BILITOT 0.4  PROT 8.6*  ALBUMIN 3.5    Recent Labs Lab 02/19/17 0359  LIPASE 49   No results for input(s): AMMONIA in the last 168 hours. Coagulation Profile: No results for input(s): INR, PROTIME in the last 168 hours. Cardiac Enzymes: No results for input(s): CKTOTAL, CKMB, CKMBINDEX, TROPONINI in the last 168 hours. BNP (last 3 results) No results for input(s): PROBNP in the last 8760 hours. HbA1C: No results for input(s): HGBA1C in the last 72 hours. CBG:  Recent Labs Lab 02/19/17 0540 02/19/17 0634 02/19/17 0840 02/19/17 0931 02/19/17 1118  GLUCAP 34* 104* 52* 66 71  Lipid Profile: No results for input(s): CHOL, HDL, LDLCALC, TRIG, CHOLHDL, LDLDIRECT in the last 72 hours. Thyroid Function Tests: No results for input(s): TSH, T4TOTAL, FREET4, T3FREE, THYROIDAB in the last 72 hours. Anemia Panel: No results for input(s): VITAMINB12, FOLATE, FERRITIN, TIBC, IRON, RETICCTPCT in the last 72 hours. Urine analysis:    Component Value Date/Time   COLORURINE YELLOW 02/19/2017 0337   APPEARANCEUR HAZY (A) 02/19/2017 0337   LABSPEC 1.006 02/19/2017 0337   PHURINE 7.0 02/19/2017 0337   GLUCOSEU NEGATIVE 02/19/2017 0337   HGBUR SMALL (A) 02/19/2017 0337   BILIRUBINUR NEGATIVE 02/19/2017 0337   KETONESUR NEGATIVE 02/19/2017 0337   PROTEINUR NEGATIVE 02/19/2017 0337   UROBILINOGEN 0.2 12/31/2010 0840   NITRITE POSITIVE (A) 02/19/2017 0337   LEUKOCYTESUR TRACE (A) 02/19/2017 0337    Creatinine Clearance: CrCl cannot be calculated (Unknown ideal weight.).  Sepsis Labs: @LABRCNTIP (procalcitonin:4,lacticidven:4) )No results found for this or any previous visit (from the past 240  hour(s)).   Radiological Exams on Admission: No results found.  Assessment/Plan Active Problems:   Hypoglycemia   Acute lower UTI   Nausea & vomiting   Abdominal pain   Essential hypertension   HLD (hyperlipidemia)   AKI (acute kidney injury) ([place])   CKD (chronic kidney disease), stage III   Hypoglycemia associated with diabetes ([place])   Diabetes mellitus with complication (HCC)   Hypoglycemia: etiology not immediately clear. Pt adamantly denies purposefully or possibility of mistakenly taking glipizide. Pt is not on insulin. Pt w/ significant carb intake w/ minimal improvement in glucose levels. Pt warrants at least a few more hours of observation to ensure safety  - CBG at 14:00 (will determine further glucose checking based on results) - regular diet  Diabetes: pt has not taken her metformin or glipizide in several weeks.  - SSI - A1c - hold oral agents  NVAbd pain: likely viral gastro. Improving w/ IVF and zofran - KUB, lactic acid - continue IVF and zofran  UTI: UA as above. Endorsing polyuria. - UCX - IV Ceftriaxone  HTN: elevated at time of admission.  - continue metop, ramipril and norvasc  HLD: - continue statin  Depression: - continue lexapro  AoCKD: Cr 1.88. Baseline 1.2-1.4. Likely from GI loss and little oral intake in last 36-48 hrs. NS bolus in ED  - IVF - BMET in am   DVT prophylaxis: Hep  Code Status: full  Family Communication: none  Disposition Plan: pending repeat labs and improvement in condition  Consults called: none  Admission status: observation - potential PM discharge.     Karo Rog J MD [facility]  If 7PM-7AM, please contact night-coverage [email or web address] Password [email or web address]  02/19/2017, 1:01 PM

## 2017-02-19 NOTE — ED Notes (Signed)
Provided another orange juice to patient to increase blood sugar.

## 2017-02-20 DIAGNOSIS — E162 Hypoglycemia, unspecified: Secondary | ICD-10-CM | POA: Diagnosis not present

## 2017-02-20 DIAGNOSIS — I10 Essential (primary) hypertension: Secondary | ICD-10-CM | POA: Diagnosis not present

## 2017-02-20 DIAGNOSIS — R112 Nausea with vomiting, unspecified: Secondary | ICD-10-CM

## 2017-02-20 DIAGNOSIS — B961 Klebsiella pneumoniae [K. pneumoniae] as the cause of diseases classified elsewhere: Secondary | ICD-10-CM

## 2017-02-20 DIAGNOSIS — N179 Acute kidney failure, unspecified: Secondary | ICD-10-CM | POA: Diagnosis not present

## 2017-02-20 DIAGNOSIS — E118 Type 2 diabetes mellitus with unspecified complications: Secondary | ICD-10-CM | POA: Diagnosis not present

## 2017-02-20 DIAGNOSIS — B9689 Other specified bacterial agents as the cause of diseases classified elsewhere: Secondary | ICD-10-CM | POA: Diagnosis present

## 2017-02-20 DIAGNOSIS — E785 Hyperlipidemia, unspecified: Secondary | ICD-10-CM | POA: Diagnosis not present

## 2017-02-20 DIAGNOSIS — E11649 Type 2 diabetes mellitus with hypoglycemia without coma: Secondary | ICD-10-CM | POA: Diagnosis not present

## 2017-02-20 DIAGNOSIS — N390 Urinary tract infection, site not specified: Secondary | ICD-10-CM | POA: Diagnosis not present

## 2017-02-20 DIAGNOSIS — N183 Chronic kidney disease, stage 3 (moderate): Secondary | ICD-10-CM

## 2017-02-20 LAB — BASIC METABOLIC PANEL
ANION GAP: 6 (ref 5–15)
BUN: 16 mg/dL (ref 6–20)
CALCIUM: 8.5 mg/dL — AB (ref 8.9–10.3)
CO2: 21 mmol/L — ABNORMAL LOW (ref 22–32)
Chloride: 112 mmol/L — ABNORMAL HIGH (ref 101–111)
Creatinine, Ser: 1.77 mg/dL — ABNORMAL HIGH (ref 0.44–1.00)
GFR calc Af Amer: 29 mL/min — ABNORMAL LOW (ref >60)
GFR, EST NON AFRICAN AMERICAN: 25 mL/min — AB (ref >60)
GLUCOSE: 103 mg/dL — AB (ref 65–99)
POTASSIUM: 4.5 mmol/L (ref 3.5–5.1)
SODIUM: 139 mmol/L (ref 135–145)

## 2017-02-20 LAB — CBC
HCT: 28.8 % — ABNORMAL LOW (ref 36.0–46.0)
Hemoglobin: 8.5 g/dL — ABNORMAL LOW (ref 12.0–15.0)
MCH: 20.2 pg — ABNORMAL LOW (ref 26.0–34.0)
MCHC: 29.5 g/dL — ABNORMAL LOW (ref 30.0–36.0)
MCV: 68.4 fL — AB (ref 78.0–100.0)
PLATELETS: 139 10*3/uL — AB (ref 150–400)
RBC: 4.21 MIL/uL (ref 3.87–5.11)
RDW: 15.6 % — AB (ref 11.5–15.5)
WBC: 4.6 10*3/uL (ref 4.0–10.5)

## 2017-02-20 LAB — GLUCOSE, CAPILLARY
GLUCOSE-CAPILLARY: 91 mg/dL (ref 65–99)
GLUCOSE-CAPILLARY: 103 mg/dL — AB (ref 65–99)

## 2017-02-20 LAB — HEMOGLOBIN A1C
Hgb A1c MFr Bld: 5.8 % — ABNORMAL HIGH (ref 4.8–5.6)
Mean Plasma Glucose: 120 mg/dL

## 2017-02-20 MED ORDER — POLYETHYLENE GLYCOL 3350 17 G PO PACK: 17.0000 g | PACK | Freq: Every day | ORAL | 0 refills | Status: DC

## 2017-02-20 MED ORDER — POLYETHYLENE GLYCOL 3350 17 G PO PACK: 17.0000 g | PACK | Freq: Two times a day (BID) | ORAL | Status: DC

## 2017-02-20 MED ORDER — INSULIN ASPART 100 UNIT/ML ~~LOC~~ SOLN: 0.0000 [IU] | SUBCUTANEOUS | Status: DC

## 2017-02-20 MED ORDER — CEPHALEXIN 250 MG PO CAPS: 250.0000 mg | ORAL_CAPSULE | Freq: Two times a day (BID) | ORAL | 0 refills | Status: AC

## 2017-02-20 NOTE — Progress Notes (Signed)
Notified patient she has d/c orders and asked if she had a ride she could call, she said no, that she would call a cab. I gave her the number to call a cab and told her when I gave her the paperwork she could call.

## 2017-02-20 NOTE — Discharge Summary (Signed)
Physician Discharge Summary  [name]:[id number] DOB: 1931-07-22 DOA: 02/19/2017  PCP: Charolette Forward, MD  Admit date: 02/19/2017 Discharge date: 02/20/2017  Time spent: 65 minutes  Recommendations for Outpatient Follow-up:  1. Follow-up with Charolette Forward, MD 1-2 weeks. On follow-up patient's diabetes and need to be reassessed as patient did have a hypoglycemic episode and oral hypoglycemic agents have been discontinued. Patient will also need a basic metabolic profile done to follow-up on electrolytes and renal function. Sensitivities to urine cultures will need to be followed up upon as patient was discharged on Keflex.   Discharge Diagnoses:  Principal Problem:   Hypoglycemia associated with diabetes ([place]) Active Problems:   UTI due to Klebsiella species   Hypoglycemia   Acute lower UTI   Nausea & vomiting   Abdominal pain   Essential hypertension   HLD (hyperlipidemia)   AKI (acute kidney injury) ([place])   CKD (chronic kidney disease), stage III   Diabetes mellitus with complication [facility])   Discharge Condition: Stable and improved  Diet recommendation: Heart healthy  Filed Weights   02/19/17 1418 02/19/17 2130  Weight: 69.3 kg (152 lb 12.5 oz) 68 kg (150 lb)    History of present illness:  Per Dr Genene Churn is a 83 y.o. female with medical history significant of CAD, diabetes, GI bleed, hyperlipidemia, hypertension, CVA. Patient presented with gradual onset nausea vomiting diaphoresis and chills. Started approximately 22:00 on 02/18/2017. Patient denied eating any spoiled food or similarly sick contacts. Patient lives at home with her son. No alleviating or aggravating factors. Denied diarrhea, dysuria, flank pain, neck stiffness, headache, focal neurological deficit, syncope, chest pain, shortness breath, palpitations, fevers. Patient did endorse diffuse abdominal pain that was mild and intermittent, poor by mouth intake for the last 36-48 hours, and  frequency.  ED Course: patient noted to be profoundly hypoglycemic in the emergency room with a glucose of 34. Administered D50 with improvement in glucose but then quickly dropped again to 60 with patient feeling symptomatic. Patient given multiple rounds of apple juice, orange juice, full lunch meal and crackers. Glucose only up to 70 at that point time.   Hospital Course:  #1 hypoglycemia/type 2 diabetes mellitus hemoglobin A1c 5.8 Unknown etiology. Patient denied mistakenly taking oral hypoglycemic agents purposefully. Patient noted not to be on any insulin. Patient had to be given significant carbohydrate intake with minimal improvement with her glucose levels. Patient was admitted placed on IV fluids as well as a regular diet and monitored. Urinalysis which was done was worrisome for UTI. Urine cultures did grow Klebsiella pneumonia have a sensitivities were pending at time of discharge. Patient was placed empirically on IV Rocephin. Patient improved clinically during the hospitalization did not have any further hypoglycemic episodes. Patient was very insistent on being discharged. Patient's CBGs ranged between 91-117. Patient's hemoglobin A1c was 5.8. Patient was tolerating a diet. Patient will be discharged home in stable and improved condition OFF her oral hypoglycemic agents. Patient will also be discharged on oral Ceftin to complete a course of antibiotic treatment for UTI. Patient is to follow-up with PCP in outpatient setting.  #2 Klebsiella pneumoniae UTI Urinalysis done on admission was nitrite positive trace leukocytes. Urine cultures obtained with greater than 100,000 Klebsiella pneumonia. Sensitivities were pending at the time of discharge. Patient was very insistent on being discharged. Patient was placed empirically on IV Rocephin during the hospitalization remained afebrile. Patient improved clinically. Patient will be discharged home on 6 more days of oral  Keflex to complete a  course of antibiotic treatment. Outpatient follow-up.  #3 nausea/vomiting/abdominal pain Likely secondary to viral gastroenteritis. Patient was placed on IV fluid as well as antiemetics. KUB was obtained which was unremarkable. Patient improved clinically did not have any further bows of nausea vomiting or abdominal pain. Patient was tolerating a regular diet by day of discharge. Outpatient follow-up.  #4 hypertension Patient was noted to have elevated blood pressure during the hospitalization. Patient was continued on home regimen of metoprolol and Norvasc. Patient's ramipril was discontinued secondary to elevated creatinine. Outpatient follow-up.  #5 hyperlipidemia Patient was maintained on home regimen stand.  #6 depression Remained stable. Patient was maintained on home regimen of Lexapro.  #7 acute on chronic kidney disease stage II/III Patient on admission was noted to have a creatinine of 1.88 with a baseline ranging from 1.2 to approximately 1.4. It was felt this was likely a prerenal azotemia. Patient was hydrated with IV fluids. Patient had some improvement with renal function. Patient's ACE inhibitor was also discontinued during the hospitalization. Patient's renal function improved with a creatinine of 1.77 on day of discharge. Patient was insistent on being discharged. Outpatient follow-up with PCP.   Procedures:  Abdominal x-ray 02/19/2017  Consultations:  None  Discharge Exam: Vitals:   02/20/17 0500 02/20/17 0846  BP: 130/62 (!) 142/75  Pulse: 62   Resp: 18   Temp: 97.7 F (36.5 C)     General: NAD Cardiovascular: RRR Respiratory: CTAB  Discharge Instructions   Discharge Instructions    Diet - low sodium heart healthy    Complete by:  As directed    Increase activity slowly    Complete by:  As directed      Current Discharge Medication List    START taking these medications   Details  cephALEXin (KEFLEX) 250 MG capsule Take 1 capsule (250 mg  total) by mouth 2 (two) times daily. Take for 6 days then stop. Qty: 12 capsule, Refills: 0    ondansetron (ZOFRAN) 4 MG tablet Take 1 tablet (4 mg total) by mouth every 8 (eight) hours as needed for nausea or vomiting. Qty: 6 tablet, Refills: 0    polyethylene glycol (MIRALAX / GLYCOLAX) packet Take 17 g by mouth daily. Qty: 14 each, Refills: 0      CONTINUE these medications which have NOT CHANGED   Details  allopurinol (ZYLOPRIM) 300 MG tablet Take 150 mg by mouth daily.    amLODipine (NORVASC) 5 MG tablet Take 5 mg by mouth daily.    aspirin 81 MG tablet Take 81 mg by mouth daily as needed for pain.    atorvastatin (LIPITOR) 20 MG tablet Take 20 mg by mouth daily. Refills: 0    escitalopram (LEXAPRO) 10 MG tablet Take 10 mg by mouth daily.    isosorbide mononitrate (IMDUR) 30 MG 24 hr tablet Take 60 mg by mouth daily.    metoprolol succinate (TOPROL-XL) 50 MG 24 hr tablet Take 50 mg by mouth daily.    nitroGLYCERIN (NITROSTAT) 0.4 MG SL tablet Place 1 tablet (0.4 mg total) under the tongue every 5 (five) minutes as needed for chest pain. Qty: 25 tablet, Refills: 3      STOP taking these medications     glipiZIDE (GLUCOTROL XL) 10 MG 24 hr tablet      metFORMIN (GLUCOPHAGE) 500 MG tablet      ramipril (ALTACE) 10 MG capsule        No Known Allergies Follow-up Information  Charolette Forward, MD Follow up in 1 week(s).   Specialty:  Cardiology Why:  f/u in 1-2 weeks. Contact information: [address] [place] [address] [phone]            The results of significant diagnostics from this hospitalization (including imaging, microbiology, ancillary and laboratory) are listed below for reference.    Significant Diagnostic Studies: Dg Abd 1 View  Result Date: 02/19/2017 CLINICAL DATA:  Abdominal pain. EXAM: ABDOMEN - 1 VIEW COMPARISON:  KUB 12/03/2013.  CT 04/05/2008. FINDINGS: Soft tissue structures are unremarkable. No bowel  distention. Stool noted throughout the colon. No free air. Circum linear calcification noted over the left upper quadrant consistent with previously identified calcified renal cyst. Aortoiliac atherosclerotic vascular calcification . IMPRESSION: 1.  No acute intra-abdominal abnormality. 2. Calcified left renal cyst again noted. Aortoiliac atherosclerotic vascular disease. Electronically Signed   By: Marcello Moores  Register   On: 02/19/2017 13:47   Nm Thyroid Sng Uptake W/imaging  Result Date: 01/30/2017 CLINICAL DATA:  Elevated TSH, weakness, nervousness, difficulty sleeping, fatigue, tired, constipation, dry skin with dry coarse hair EXAM: THYROID SCAN AND UPTAKE - 24 HOURS TECHNIQUE: Following the per oral administration of I-131 sodium iodide, the patient returned at 24 hours and uptake measurements were acquired with the uptake probe centered on the neck. Thyroid imaging was performed following the intravenous administration of the Tc-59m Pertechnetate. RADIOPHARMACEUTICALS:  6.3 MicroCuries I-131 sodium iodide orally and 10.26 mCi Technetium-41m pertechnetate IV COMPARISON:  None FINDINGS: 24 hour radio iodine uptake is calculated at 9%, just below the normal range. Images of the thyroid gland in 3 projections demonstrate a questionable subtle cold nodule at the inferior pole the LEFT lobe. No additional areas of increased or decreased tracer localization seen. IMPRESSION: Minimally decreased 24 hour radio iodine uptake of 9%. Question subtle cold nodule at the inferior pole of the LEFT thyroid lobe ; thyroid ultrasound recommended to exclude thyroid mass/malignancy. Electronically Signed   By: Lavonia Dana M.D.   On: 01/30/2017 15:48    Microbiology: Recent Results (from the past 240 hour(s))  Urine culture     Status: Abnormal (Preliminary result)   Collection Time: 02/19/17  7:40 AM  Result Value Ref Range Status   Specimen Description URINE, CLEAN CATCH  Final   Special Requests NONE  Final   Culture  (A)  Final    >=100,000 COLONIES/mL KLEBSIELLA PNEUMONIAE SUSCEPTIBILITIES TO FOLLOW    Report Status PENDING  Incomplete     Labs: Basic Metabolic Panel:  Recent Labs Lab 02/19/17 0359 02/19/17 1417 02/20/17 0646  NA 141 142 139  K 4.2 4.7 4.5  CL 109 111 112*  CO2 23 22 21*  GLUCOSE 36* 99 103*  BUN 20 18 16   CREATININE 1.88* 1.82* 1.77*  CALCIUM 9.1 8.9 8.5*   Liver Function Tests:  Recent Labs Lab 02/19/17 0359  AST 29  ALT 10*  ALKPHOS 89  BILITOT 0.4  PROT 8.6*  ALBUMIN 3.5    Recent Labs Lab 02/19/17 0359  LIPASE 49   No results for input(s): AMMONIA in the last 168 hours. CBC:  Recent Labs Lab 02/19/17 0359 02/20/17 0646  WBC 6.0 4.6  HGB 9.7* 8.5*  HCT 31.4* 28.8*  MCV 68.3* 68.4*  PLT 170 139*   Cardiac Enzymes: No results for input(s): CKTOTAL, CKMB, CKMBINDEX, TROPONINI in the last 168 hours. BNP: BNP (last 3 results) No results for input(s): BNP in the last 8760 hours.  ProBNP (last 3  results) No results for input(s): PROBNP in the last 8760 hours.  CBG:  Recent Labs Lab 02/19/17 1414 02/19/17 1735 02/19/17 2125 02/20/17 0802 02/20/17 1236  GLUCAP 107* 117* 100* 91 103*       Signed:  Lundon Verdejo MD.  [facility] 02/20/2017, 1:42 PM

## 2017-02-20 NOTE — Progress Notes (Signed)
Patient wanting to go home stating she has stuff to do, I explained that if she wanted to go it would probably be AMA because we were still trying to figure out what was going on, she is stating she wants to speak with the doctor. I paged MD Grandville Silos he said he will come see her soon.

## 2017-02-20 NOTE — Progress Notes (Signed)
Paged MD Grandville Silos again because he had not seen patient, he said he is on the way.

## 2017-02-20 NOTE — Care Management Obs Status (Signed)
[place] NOTIFICATION   Patient Details  Name: [name] MRN: [id number] Date of Birth: [birth year]   Medicare Observation Status Notification Given:  Yes    Marilu Favre, RN 02/20/2017, 11:10 AM

## 2017-02-20 NOTE — Progress Notes (Signed)
[name] to be D/C'd  per MD order. Discussed with the patient and all questions fully answered.  VSS, Skin clean, dry and intact without evidence of skin break down, no evidence of skin tears noted.  IV catheter discontinued intact. Site without signs and symptoms of complications. Dressing and pressure applied.  An After Visit Summary was printed and given to the patient. Patient received prescription.  D/c education completed with patient/family including follow up instructions, medication list, d/c activities limitations if indicated, with other d/c instructions as indicated by MD - patient able to verbalize understanding, all questions fully answered.   Patient instructed to return to ED, call 911, or call MD for any changes in condition.    I helped patient to call cab from room phone, we arranged for [employer] to pick her up and they said they would call the room phone when they are on the way.

## 2017-02-21 LAB — URINE CULTURE

## 2017-08-26 ENCOUNTER — Ambulatory Visit: Payer: Medicare HMO | Admitting: Neurology

## 2017-08-26 ENCOUNTER — Telehealth: Payer: Self-pay | Admitting: *Deleted

## 2017-08-26 NOTE — Telephone Encounter (Signed)
No showed new patient appointment. 

## 2017-08-27 ENCOUNTER — Encounter: Payer: Self-pay | Admitting: Neurology

## 2017-12-07 ENCOUNTER — Ambulatory Visit: Payer: Medicare HMO | Admitting: Neurology

## 2017-12-07 ENCOUNTER — Telehealth: Payer: Self-pay | Admitting: *Deleted

## 2017-12-07 NOTE — Telephone Encounter (Signed)
New patient appt cancelled same day due to lack of co-pay.

## 2018-03-27 ENCOUNTER — Encounter (HOSPITAL_COMMUNITY): Payer: Self-pay

## 2018-03-27 ENCOUNTER — Emergency Department (HOSPITAL_COMMUNITY)
Admission: EM | Admit: 2018-03-27 | Discharge: 2018-03-27 | Disposition: A | Discharge status: Home / Self Care | Payer: Medicare HMO | Attending: Emergency Medicine | Admitting: Emergency Medicine

## 2018-03-27 DIAGNOSIS — R070 Pain in throat: Secondary | ICD-10-CM | POA: Diagnosis present

## 2018-03-27 DIAGNOSIS — Z79899 Other long term (current) drug therapy: Secondary | ICD-10-CM | POA: Insufficient documentation

## 2018-03-27 DIAGNOSIS — Z951 Presence of aortocoronary bypass graft: Secondary | ICD-10-CM | POA: Diagnosis not present

## 2018-03-27 DIAGNOSIS — I2510 Atherosclerotic heart disease of native coronary artery without angina pectoris: Secondary | ICD-10-CM | POA: Diagnosis not present

## 2018-03-27 DIAGNOSIS — R1310 Dysphagia, unspecified: Secondary | ICD-10-CM | POA: Diagnosis not present

## 2018-03-27 DIAGNOSIS — Z87891 Personal history of nicotine dependence: Secondary | ICD-10-CM | POA: Insufficient documentation

## 2018-03-27 DIAGNOSIS — E1122 Type 2 diabetes mellitus with diabetic chronic kidney disease: Secondary | ICD-10-CM | POA: Insufficient documentation

## 2018-03-27 DIAGNOSIS — I129 Hypertensive chronic kidney disease with stage 1 through stage 4 chronic kidney disease, or unspecified chronic kidney disease: Secondary | ICD-10-CM | POA: Diagnosis not present

## 2018-03-27 DIAGNOSIS — Z955 Presence of coronary angioplasty implant and graft: Secondary | ICD-10-CM | POA: Diagnosis not present

## 2018-03-27 DIAGNOSIS — Z7982 Long term (current) use of aspirin: Secondary | ICD-10-CM | POA: Insufficient documentation

## 2018-03-27 DIAGNOSIS — N183 Chronic kidney disease, stage 3 (moderate): Secondary | ICD-10-CM | POA: Insufficient documentation

## 2018-03-27 NOTE — ED Triage Notes (Signed)
PT drinking water no difficulties noted.

## 2018-03-27 NOTE — ED Provider Notes (Signed)
[facility] EMERGENCY DEPARTMENT Provider Note   CSN: [id number] Arrival date & time: 03/27/18  1348     History   Chief Complaint Chief Complaint  Patient presents with  . food bolus    HPI [name] is a 84 y.o. female.  Patient reports a chicken food bolus stuck in her esophagus at lunchtime today.  Initially, she was unable to swallow water.  After reporting to the emergency department, she was ultimately able to swallow and stated that her impaction had improved.  She is now swallowing appropriately.  No other complaints.     Past Medical History:  Diagnosis Date  . Coronary artery disease   . Diabetes mellitus without complication ([place])    TYPE 2   . GI bleeding 09/2015  . Hyperlipidemia   . Hypertension   . Stroke [facility])     Patient Active Problem List   Diagnosis Date Noted  . UTI due to Klebsiella species 02/20/2017  . Hypoglycemia 02/19/2017  . Acute lower UTI 02/19/2017  . Nausea & vomiting 02/19/2017  . Abdominal pain 02/19/2017  . Essential hypertension 02/19/2017  . HLD (hyperlipidemia) 02/19/2017  . AKI (acute kidney injury) ([place]) 02/19/2017  . CKD (chronic kidney disease), stage III ([place]) 02/19/2017  . Hypoglycemia associated with diabetes ([place]) 02/19/2017  . Diabetes mellitus with complication ([place]) 63/09/6008  . Acute lower GI bleeding 10/08/2015    Past Surgical History:  Procedure Laterality Date  . ABDOMINAL HYSTERECTOMY    . CAPSULOTOMY  05/11/2012   Procedure: MINOR CAPSULOTOMY;  Surgeon: Myrtha Mantis., MD;  Location: [facility] ENDOSCOPY;  Service: Ophthalmology;  Laterality: Left;  Yag capsulotomy  . COLONOSCOPY N/A 07/11/2013   Procedure: COLONOSCOPY;  Surgeon: Juanita Craver, MD;  Location: [facility] ENDOSCOPY;  Service: Endoscopy;  Laterality: N/A;  . CORONARY ANGIOPLASTY WITH STENT PLACEMENT    . CORONARY ARTERY BYPASS GRAFT    . ESOPHAGOGASTRODUODENOSCOPY N/A 07/11/2013   Procedure: ESOPHAGOGASTRODUODENOSCOPY  (EGD);  Surgeon: Juanita Craver, MD;  Location: [facility] ENDOSCOPY;  Service: Endoscopy;  Laterality: N/A;  . LEFT HEART CATHETERIZATION WITH CORONARY/GRAFT ANGIOGRAM N/A 12/09/2012   Procedure: LEFT HEART CATHETERIZATION WITH CORONARY/GRAFT ANGIOGRAM;  Surgeon: Clent Demark, MD;  Location: [facility] CATH LAB;  Service: Cardiovascular;  Laterality: N/A;     OB History   None      Home Medications    Prior to Admission medications   Medication Sig Start Date End Date Taking? Authorizing Provider  aspirin EC 81 MG tablet Take 81 mg by mouth 2 (two) times a week.   Yes [provider]  allopurinol (ZYLOPRIM) 300 MG tablet Take 150 mg by mouth daily.    [provider]  amLODipine (NORVASC) 5 MG tablet Take 5 mg by mouth daily.    [provider]  atorvastatin (LIPITOR) 20 MG tablet Take 20 mg by mouth daily. 01/12/17   [provider]  escitalopram (LEXAPRO) 10 MG tablet Take 10 mg by mouth daily. 02/08/17   [provider]  isosorbide mononitrate (IMDUR) 30 MG 24 hr tablet Take 60 mg by mouth daily.    [provider]  metoprolol succinate (TOPROL-XL) 50 MG 24 hr tablet Take 50 mg by mouth daily. 12/29/16   [provider]  nitroGLYCERIN (NITROSTAT) 0.4 MG SL tablet Place 1 tablet (0.4 mg total) under the tongue every 5 (five) minutes as needed for chest pain. Patient not taking: Reported on 03/27/2018 12/09/12   Charolette Forward, MD  ondansetron [facility])  4 MG tablet Take 1 tablet (4 mg total) by mouth every 8 (eight) hours as needed for nausea or vomiting. Patient not taking: Reported on 03/27/2018 02/19/17   Rolland Porter, MD  polyethylene glycol [facility] Floria Raveling) packet Take 17 g by mouth daily. Patient not taking: Reported on 03/27/2018 02/20/17   Eugenie Filler, MD    Family History Family History  Family history unknown: Yes    Social History Social History   Tobacco Use  . Smoking status: Former Research scientist (life sciences)  . Smokeless tobacco: Never Used    . Tobacco comment: quit smoking many years ago  Substance Use Topics  . Alcohol use: No  . Drug use: No     Allergies   Patient has no known allergies.   Review of Systems Review of Systems  All other systems reviewed and are negative.    Physical Exam Updated Vital Signs BP (!) 145/58 (BP Location: Right Arm)   Pulse 85   Temp 98.4 F (36.9 C) (Oral)   Resp 20   Ht 5\' 7"  (1.702 m)   Wt 65.8 kg (145 lb)   SpO2 100%   BMI 22.71 kg/m   Physical Exam  Constitutional: She is oriented to person, place, and time. She appears well-developed and well-nourished.  HENT:  Head: Normocephalic and atraumatic.  Eyes: Conjunctivae are normal.  Neck: Neck supple.  Cardiovascular: Normal rate and regular rhythm.  Pulmonary/Chest: Effort normal and breath sounds normal.  Abdominal: Soft. Bowel sounds are normal.  Musculoskeletal: Normal range of motion.  Neurological: She is alert and oriented to person, place, and time.  Skin: Skin is warm and dry.  Psychiatric: She has a normal mood and affect. Her behavior is normal.  Nursing note and vitals reviewed.    ED Treatments / Results  Labs (all labs ordered are listed, but only abnormal results are displayed) Labs Reviewed - No data to display  EKG None  Radiology No results found.  Procedures Procedures (including critical care time)  Medications Ordered in ED Medications - No data to display   Initial Impression / Assessment and Plan / ED Course  I have reviewed the triage vital signs and the nursing notes.  Pertinent labs & imaging results that were available during my care of the patient were reviewed by me and considered in my medical decision making (see chart for details).     Patient presents with a food bolus in her esophagus.  Symptoms have improved.  She is able to swallow appropriately at discharge.  Final Clinical Impressions(s) / ED Diagnoses   Final diagnoses:  Dysphagia, unspecified type     ED Discharge Orders    None       Nat Christen, MD 03/27/18 [phone]

## 2018-03-27 NOTE — ED Notes (Signed)
Declined W/C at D/C and was escorted to lobby by RN. 

## 2018-03-27 NOTE — Discharge Instructions (Addendum)
Follow up your primary care dr

## 2018-03-27 NOTE — ED Triage Notes (Signed)
Pt eating chicken in piece of bread, got chocked.  Pt still feels like something in throat.  Pt drank water with no problem, still feels something in throat.

## 2018-06-16 ENCOUNTER — Encounter (HOSPITAL_COMMUNITY): Payer: Self-pay | Admitting: Emergency Medicine

## 2018-06-16 ENCOUNTER — Observation Stay (HOSPITAL_COMMUNITY)
Admission: EM | Admit: 2018-06-16 | Discharge: 2018-06-17 | Disposition: A | Discharge status: Home / Self Care | Payer: Medicare HMO | Attending: Cardiology | Admitting: Cardiology

## 2018-06-16 ENCOUNTER — Other Ambulatory Visit: Payer: Self-pay

## 2018-06-16 DIAGNOSIS — M1990 Unspecified osteoarthritis, unspecified site: Secondary | ICD-10-CM | POA: Diagnosis not present

## 2018-06-16 DIAGNOSIS — E785 Hyperlipidemia, unspecified: Secondary | ICD-10-CM | POA: Insufficient documentation

## 2018-06-16 DIAGNOSIS — I2510 Atherosclerotic heart disease of native coronary artery without angina pectoris: Secondary | ICD-10-CM | POA: Insufficient documentation

## 2018-06-16 DIAGNOSIS — D509 Iron deficiency anemia, unspecified: Principal | ICD-10-CM | POA: Insufficient documentation

## 2018-06-16 DIAGNOSIS — N183 Chronic kidney disease, stage 3 (moderate): Secondary | ICD-10-CM | POA: Insufficient documentation

## 2018-06-16 DIAGNOSIS — I129 Hypertensive chronic kidney disease with stage 1 through stage 4 chronic kidney disease, or unspecified chronic kidney disease: Secondary | ICD-10-CM | POA: Insufficient documentation

## 2018-06-16 DIAGNOSIS — E1122 Type 2 diabetes mellitus with diabetic chronic kidney disease: Secondary | ICD-10-CM | POA: Diagnosis not present

## 2018-06-16 DIAGNOSIS — Z8673 Personal history of transient ischemic attack (TIA), and cerebral infarction without residual deficits: Secondary | ICD-10-CM | POA: Diagnosis not present

## 2018-06-16 DIAGNOSIS — Z951 Presence of aortocoronary bypass graft: Secondary | ICD-10-CM | POA: Insufficient documentation

## 2018-06-16 DIAGNOSIS — N179 Acute kidney failure, unspecified: Secondary | ICD-10-CM | POA: Diagnosis not present

## 2018-06-16 DIAGNOSIS — Z7982 Long term (current) use of aspirin: Secondary | ICD-10-CM | POA: Diagnosis not present

## 2018-06-16 DIAGNOSIS — Z87891 Personal history of nicotine dependence: Secondary | ICD-10-CM | POA: Diagnosis not present

## 2018-06-16 DIAGNOSIS — D649 Anemia, unspecified: Secondary | ICD-10-CM

## 2018-06-16 DIAGNOSIS — Z955 Presence of coronary angioplasty implant and graft: Secondary | ICD-10-CM | POA: Diagnosis not present

## 2018-06-16 LAB — CBC
HEMATOCRIT: 23.7 % — AB (ref 36.0–46.0)
Hemoglobin: 6.2 g/dL — CL (ref 12.0–15.0)
MCH: 16.1 pg — ABNORMAL LOW (ref 26.0–34.0)
MCHC: 26.2 g/dL — ABNORMAL LOW (ref 30.0–36.0)
MCV: 61.4 fL — AB (ref 78.0–100.0)
Platelets: 192 10*3/uL (ref 150–400)
RBC: 3.86 MIL/uL — AB (ref 3.87–5.11)
RDW: 20.9 % — ABNORMAL HIGH (ref 11.5–15.5)
WBC: 5.3 10*3/uL (ref 4.0–10.5)

## 2018-06-16 LAB — COMPREHENSIVE METABOLIC PANEL
ALT: 7 U/L (ref 0–44)
AST: 24 U/L (ref 15–41)
Albumin: 3.3 g/dL — ABNORMAL LOW (ref 3.5–5.0)
Alkaline Phosphatase: 68 U/L (ref 38–126)
Anion gap: 10 (ref 5–15)
BUN: 19 mg/dL (ref 8–23)
CHLORIDE: 109 mmol/L (ref 98–111)
CO2: 22 mmol/L (ref 22–32)
Calcium: 9.1 mg/dL (ref 8.9–10.3)
Creatinine, Ser: 1.93 mg/dL — ABNORMAL HIGH (ref 0.44–1.00)
GFR, EST AFRICAN AMERICAN: 26 mL/min — AB (ref >60)
GFR, EST NON AFRICAN AMERICAN: 23 mL/min — AB (ref >60)
Glucose, Bld: 127 mg/dL — ABNORMAL HIGH (ref 70–99)
Potassium: 4.1 mmol/L (ref 3.5–5.1)
SODIUM: 141 mmol/L (ref 135–145)
Total Bilirubin: 0.4 mg/dL (ref 0.3–1.2)
Total Protein: 7.4 g/dL (ref 6.5–8.1)

## 2018-06-16 LAB — POC OCCULT BLOOD, ED: FECAL OCCULT BLD: NEGATIVE

## 2018-06-16 LAB — GLUCOSE, CAPILLARY: Glucose-Capillary: 93 mg/dL (ref 70–99)

## 2018-06-16 LAB — PREPARE RBC (CROSSMATCH)

## 2018-06-16 MED ORDER — POLYETHYLENE GLYCOL 3350 17 G PO PACK
17.0000 g | PACK | Freq: Every day | ORAL | Status: DC
  Administered 2018-06-17: 17 g via ORAL
  Filled 2018-06-16 (×2): qty 1

## 2018-06-16 MED ORDER — SODIUM CHLORIDE 0.9 % IV SOLN
INTRAVENOUS | Status: DC
  Administered 2018-06-16: 20:00:00 via INTRAVENOUS

## 2018-06-16 MED ORDER — AMLODIPINE BESYLATE 2.5 MG PO TABS
2.5000 mg | ORAL_TABLET | Freq: Every day | ORAL | Status: DC
  Administered 2018-06-17: 2.5 mg via ORAL
  Filled 2018-06-16 (×2): qty 1

## 2018-06-16 MED ORDER — INSULIN ASPART 100 UNIT/ML ~~LOC~~ SOLN: 0.0000 [IU] | Freq: Three times a day (TID) | SUBCUTANEOUS | Status: DC

## 2018-06-16 MED ORDER — PANTOPRAZOLE SODIUM 40 MG PO TBEC
40.0000 mg | DELAYED_RELEASE_TABLET | Freq: Every day | ORAL | Status: DC
  Administered 2018-06-16 – 2018-06-17 (×2): 40 mg via ORAL
  Filled 2018-06-16 (×2): qty 1

## 2018-06-16 MED ORDER — METOPROLOL SUCCINATE ER 50 MG PO TB24
50.0000 mg | ORAL_TABLET | Freq: Every day | ORAL | Status: DC
  Administered 2018-06-16 – 2018-06-17 (×2): 50 mg via ORAL
  Filled 2018-06-16 (×2): qty 1

## 2018-06-16 MED ORDER — ISOSORBIDE MONONITRATE ER 30 MG PO TB24
30.0000 mg | ORAL_TABLET | Freq: Every day | ORAL | Status: DC
  Administered 2018-06-16 – 2018-06-17 (×2): 30 mg via ORAL
  Filled 2018-06-16 (×2): qty 1

## 2018-06-16 MED ORDER — SODIUM CHLORIDE 0.9 % IV SOLN: 10.0000 mL/h | Freq: Once | INTRAVENOUS | Status: DC

## 2018-06-16 MED ORDER — NITROGLYCERIN 0.4 MG SL SUBL: 0.4000 mg | SUBLINGUAL_TABLET | SUBLINGUAL | Status: DC | PRN

## 2018-06-16 NOTE — Progress Notes (Signed)
2nd unit PRBC completed with no adverse effect , VSS, afebrile ,respirations unlabored/denies pain  , IV site intact .

## 2018-06-16 NOTE — ED Notes (Signed)
Hgb 6.2 called to this Rn by lab. MD notified.

## 2018-06-16 NOTE — H&P (Signed)
[name] is an 84 y.o. female.   Chief Complaint: tired, fatigue, no energy FTD:DUKGURK is a 84 year old female with past medical history significant for coronary artery disease, status post CABG 2 in 1981 and subsequently had redo CABG in 2001, hypertension, diabetes mellitus, hyperlipidemia, history of TIA in the past, chronic microcytic anemia history of lower GI bleed in the past, came to the ER complaining of feeling tired, fatigue, no energy, associated with shortness of breath with minimal exertion.  Patient was noted to have hemoglobin of 6.2  Patient was seen by her PCP yesterday with similar complaints.  Had CBC drawn and was referred to the ED for further workup.  Patient denies any abdominal pain.  Denies any black tarry stools.  Denies any bright red blood per rectum.  Denies hematemesis. Patient denies any NSAIDs abuse.  Past Medical History:  Diagnosis Date  . Coronary artery disease   . Diabetes mellitus without complication ([place])    TYPE 2   . GI bleeding 09/2015  . Hyperlipidemia   . Hypertension   . Stroke [facility])     Past Surgical History:  Procedure Laterality Date  . ABDOMINAL HYSTERECTOMY    . CAPSULOTOMY  05/11/2012   Procedure: MINOR CAPSULOTOMY;  Surgeon: Myrtha Mantis., MD;  Location: [facility] ENDOSCOPY;  Service: Ophthalmology;  Laterality: Left;  Yag capsulotomy  . COLONOSCOPY N/A 07/11/2013   Procedure: COLONOSCOPY;  Surgeon: Juanita Craver, MD;  Location: [facility] ENDOSCOPY;  Service: Endoscopy;  Laterality: N/A;  . CORONARY ANGIOPLASTY WITH STENT PLACEMENT    . CORONARY ARTERY BYPASS GRAFT    . ESOPHAGOGASTRODUODENOSCOPY N/A 07/11/2013   Procedure: ESOPHAGOGASTRODUODENOSCOPY (EGD);  Surgeon: Juanita Craver, MD;  Location: [facility] ENDOSCOPY;  Service: Endoscopy;  Laterality: N/A;  . LEFT HEART CATHETERIZATION WITH CORONARY/GRAFT ANGIOGRAM N/A 12/09/2012   Procedure: LEFT HEART CATHETERIZATION WITH CORONARY/GRAFT ANGIOGRAM;  Surgeon: Clent Demark, MD;  Location: [facility]  CATH LAB;  Service: Cardiovascular;  Laterality: N/A;    Family History  Family history unknown: Yes   Social History:  reports that she has quit smoking. She has never used smokeless tobacco. She reports that she does not drink alcohol or use drugs.  Allergies: No Known Allergies   (Not in a hospital admission)  Results for orders placed or performed during the hospital encounter of 06/16/18 (from the past 48 hour(s))  Comprehensive metabolic panel     Status: Abnormal   Collection Time: 06/16/18  1:00 PM  Result Value Ref Range   Sodium 141 135 - 145 mmol/L   Potassium 4.1 3.5 - 5.1 mmol/L   Chloride 109 98 - 111 mmol/L   CO2 22 22 - 32 mmol/L   Glucose, Bld 127 (H) 70 - 99 mg/dL   BUN 19 8 - 23 mg/dL   Creatinine, Ser 1.93 (H) 0.44 - 1.00 mg/dL   Calcium 9.1 8.9 - 10.3 mg/dL   Total Protein 7.4 6.5 - 8.1 g/dL   Albumin 3.3 (L) 3.5 - 5.0 g/dL   AST 24 15 - 41 U/L   ALT 7 0 - 44 U/L   Alkaline Phosphatase 68 38 - 126 U/L   Total Bilirubin 0.4 0.3 - 1.2 mg/dL   GFR calc non Af Amer 23 (L) >60 mL/min   GFR calc Af Amer 26 (L) >60 mL/min    Comment: (NOTE) The eGFR has been calculated using the CKD EPI equation. This calculation has not been validated in all clinical situations. eGFR's persistently <60 mL/min signify possible  Chronic Kidney Disease.    Anion gap 10 5 - 15    Comment: Performed at [facility] [address] [place], [place] [address]  CBC     Status: Abnormal   Collection Time: 06/16/18  1:00 PM  Result Value Ref Range   WBC 5.3 4.0 - 10.5 K/uL   RBC 3.86 (L) 3.87 - 5.11 MIL/uL   Hemoglobin 6.2 (LL) 12.0 - 15.0 g/dL    Comment: REPEATED TO VERIFY CRITICAL RESULT CALLED TO, READ BACK BY AND VERIFIED WITH: K.BROWN,RN @ 1351 06/16/2018 [id number]    HCT 23.7 (L) 36.0 - 46.0 %   MCV 61.4 (L) 78.0 - 100.0 fL   MCH 16.1 (L) 26.0 - 34.0 pg   MCHC 26.2 (L) 30.0 - 36.0 g/dL   RDW 20.9 (H) 11.5 - 15.5 %   Platelets 192 150 - 400 K/uL    Comment:  Performed at [facility], [place] [address] [place], [place] [address]  Type and screen [place]     Status: None (Preliminary result)   Collection Time: 06/16/18  1:00 PM  Result Value Ref Range   ABO/RH(D) O POS    Antibody Screen NEG    Sample Expiration 06/19/2018    Unit Number [id number]    Blood Component Type RED CELLS,LR    Unit division 00    Status of Unit ISSUED    Transfusion Status OK TO TRANSFUSE    Crossmatch Result      Compatible Performed at [facility], [place] [address] [place], [place] [address]    Unit Number [id number]    Blood Component Type RED CELLS,LR    Unit division 00    Status of Unit ALLOCATED    Transfusion Status OK TO TRANSFUSE    Crossmatch Result Compatible   POC occult blood, ED     Status: None   Collection Time: 06/16/18  2:17 PM  Result Value Ref Range   Fecal Occult Bld NEGATIVE NEGATIVE  Prepare RBC     Status: None   Collection Time: 06/16/18  3:08 PM  Result Value Ref Range   Order Confirmation      ORDER PROCESSED BY BLOOD BANK Performed at [facility], [place] [address], [place], [place] [address]    No results found.  Review of Systems  Constitutional: Positive for malaise/fatigue. Negative for fever.  HENT: Negative for hearing loss.   Eyes: Negative for blurred vision.  Respiratory: Positive for shortness of breath.   Cardiovascular: Negative for chest pain, orthopnea and claudication.  Gastrointestinal: Negative for abdominal pain, nausea and vomiting.  Genitourinary: Negative for dysuria.  Neurological: Negative for dizziness.    Blood pressure (!) 153/52, pulse 67, temperature 98.5 F (36.9 C), temperature source Oral, resp. rate 18, SpO2 100 %. Physical Exam  Constitutional: She is oriented to person, place, and time.  Eyes:  Conjunctiva pale, sclera nonicteric  Neck: Normal range of motion. Neck supple. No JVD present. No tracheal deviation present. No thyromegaly  present.  Cardiovascular: Normal rate and regular rhythm.  Murmur: 2/6 systolic murmur noted. Respiratory: Effort normal and breath sounds normal. No respiratory distress. She has no wheezes. She has no rales.  GI: Soft. Bowel sounds are normal. She exhibits no distension. There is no tenderness.  Musculoskeletal: She exhibits no edema, tenderness or deformity.  Neurological: She is alert and oriented to person, place, and time.     Assessment/Plan Acute on chronic  microcytic hypochromicsymptomatic anemia rule out GI loss Coronary artery disease, history of CABG 2 in the past. Hypertension. Diabetes mellitus. Hyperlipidemia. History of questionable TIA in the past  Degenerative joint disease Plan As per orders. Agree with packed RBC transfusion.   Discussed with GI if hemoglobin improves appropriately.  Will follow-up with GI as outpatient Charolette Forward, MD 06/16/2018, 5:24 PM

## 2018-06-16 NOTE — ED Triage Notes (Signed)
Pt presents to ED after having a check up with her PCP yesterday.  Called today and told her hgb was "six point something".  Patient denies black, tarry stools.  Hx of GI bleed.  C/o SOB, denies pain

## 2018-06-16 NOTE — Progress Notes (Signed)
Patient sleeping with no distress, respirations unlabored , 2nd unit PRBC infusing with no adverse effect , IV site intact .

## 2018-06-16 NOTE — ED Provider Notes (Signed)
[facility] EMERGENCY DEPARTMENT Provider Note   CSN: [id number] Arrival date & time: 06/16/18  1241     History   Chief Complaint Chief Complaint  Patient presents with  . Abnormal Lab    HPI [name] is a 84 y.o. female medical history of CAD, diabetes, GI bleed, hyperlipidemia, hypertension who presents for evaluation of abnormal lab.  Was told by her PCP that her hemoglobin was 6 and that she needed to come to the ED.  Patient with no complaints of chest pain at this time.  She does report that over last few days, she has been more fatigued and tired than normal.  She additionally reports that she has had some worsening shortness of breath.  She reports that a few days ago, she had to have help walking from the hospital where she was visiting somebody to the car because of her shortness of breath and fatigue.  She has not noticed any black or tarry stools.  Patient is not currently on any blood thinners.  At this time, denies any complaints but just states she feels "tired."  Denies any fevers, chest pain, double pain, numbness/weakness of her arms or legs.  The history is provided by the patient.    Past Medical History:  Diagnosis Date  . Coronary artery disease   . Diabetes mellitus without complication ([place])    TYPE 2   . GI bleeding 09/2015  . Hyperlipidemia   . Hypertension   . Stroke [facility])     Patient Active Problem List   Diagnosis Date Noted  . UTI due to Klebsiella species 02/20/2017  . Hypoglycemia 02/19/2017  . Acute lower UTI 02/19/2017  . Nausea & vomiting 02/19/2017  . Abdominal pain 02/19/2017  . Essential hypertension 02/19/2017  . HLD (hyperlipidemia) 02/19/2017  . AKI (acute kidney injury) ([place]) 02/19/2017  . CKD (chronic kidney disease), stage III ([place]) 02/19/2017  . Hypoglycemia associated with diabetes ([place]) 02/19/2017  . Diabetes mellitus with complication ([place]) 76/19/5093  . Acute lower GI bleeding 10/08/2015     Past Surgical History:  Procedure Laterality Date  . ABDOMINAL HYSTERECTOMY    . CAPSULOTOMY  05/11/2012   Procedure: MINOR CAPSULOTOMY;  Surgeon: Myrtha Mantis., MD;  Location: [facility] ENDOSCOPY;  Service: Ophthalmology;  Laterality: Left;  Yag capsulotomy  . COLONOSCOPY N/A 07/11/2013   Procedure: COLONOSCOPY;  Surgeon: Juanita Craver, MD;  Location: [facility] ENDOSCOPY;  Service: Endoscopy;  Laterality: N/A;  . CORONARY ANGIOPLASTY WITH STENT PLACEMENT    . CORONARY ARTERY BYPASS GRAFT    . ESOPHAGOGASTRODUODENOSCOPY N/A 07/11/2013   Procedure: ESOPHAGOGASTRODUODENOSCOPY (EGD);  Surgeon: Juanita Craver, MD;  Location: [facility] ENDOSCOPY;  Service: Endoscopy;  Laterality: N/A;  . LEFT HEART CATHETERIZATION WITH CORONARY/GRAFT ANGIOGRAM N/A 12/09/2012   Procedure: LEFT HEART CATHETERIZATION WITH CORONARY/GRAFT ANGIOGRAM;  Surgeon: Clent Demark, MD;  Location: [facility] CATH LAB;  Service: Cardiovascular;  Laterality: N/A;     OB History   None      Home Medications    Prior to Admission medications   Medication Sig Start Date End Date Taking? Authorizing Provider  amLODipine (NORVASC) 2.5 MG tablet Take 2.5 mg by mouth daily.    Yes [provider]  aspirin EC 81 MG tablet Take 81 mg by mouth 2 (two) times a week.   Yes [provider]  isosorbide mononitrate (IMDUR) 30 MG 24 hr tablet Take 60 mg by mouth daily.   Yes [provider]  metoprolol  succinate (TOPROL-XL) 50 MG 24 hr tablet Take 50 mg by mouth daily. 12/29/16  Yes [provider]  nitroGLYCERIN (NITROSTAT) 0.4 MG SL tablet Place 1 tablet (0.4 mg total) under the tongue every 5 (five) minutes as needed for chest pain. 12/09/12  Yes Charolette Forward, MD  omeprazole (PRILOSEC) 40 MG capsule Take 40 mg by mouth daily. 30 minutes before breakfast 04/28/18  Yes [provider]  polyethylene glycol (MIRALAX / GLYCOLAX) packet Take 17 g by mouth daily. Patient taking differently: Take 17 g by mouth daily as  needed.  02/20/17  Yes Eugenie Filler, MD  ondansetron (ZOFRAN) 4 MG tablet Take 1 tablet (4 mg total) by mouth every 8 (eight) hours as needed for nausea or vomiting. Patient not taking: Reported on 03/27/2018 02/19/17   Rolland Porter, MD    Family History Family History  Family history unknown: Yes    Social History Social History   Tobacco Use  . Smoking status: Former Research scientist (life sciences)  . Smokeless tobacco: Never Used  . Tobacco comment: quit smoking many years ago  Substance Use Topics  . Alcohol use: No  . Drug use: No     Allergies   Patient has no known allergies.   Review of Systems Review of Systems  Constitutional: Positive for fatigue. Negative for fever.  Respiratory: Positive for shortness of breath. Negative for cough.   Cardiovascular: Negative for chest pain.  Gastrointestinal: Negative for abdominal pain, nausea and vomiting.  Genitourinary: Negative for dysuria and hematuria.  Neurological: Negative for headaches.  All other systems reviewed and are negative.    Physical Exam Updated Vital Signs BP (!) 153/52   Pulse 67   Temp 98.5 F (36.9 C) (Oral)   Resp 18   SpO2 100%   Physical Exam  Constitutional: She is oriented to person, place, and time. She appears well-developed and well-nourished.  HENT:  Head: Normocephalic and atraumatic.  Mouth/Throat: Oropharynx is clear and moist and mucous membranes are normal.  Eyes: Pupils are equal, round, and reactive to light. Conjunctivae, EOM and lids are normal.  Pale conjunctival bilaterally.  Neck: Full passive range of motion without pain.  Cardiovascular: Normal rate, regular rhythm, normal heart sounds and normal pulses. Exam reveals no gallop and no friction rub.  No murmur heard. Pulmonary/Chest: Effort normal and breath sounds normal.  Lungs clear to auscultation bilaterally.  Symmetric chest rise.  No wheezing, rales, rhonchi.  Abdominal: Soft. Normal appearance. There is no tenderness. There is no  rigidity and no guarding.  Abdomen is soft, non-distended, non-tender. No rigidity, No guarding. No peritoneal signs.  Genitourinary: Rectal exam shows guaiac negative stool.  Genitourinary Comments: The exam was performed with a chaperone present. Digital Rectal Exam reveals sphincter with good tone. No external hemorrhoids. No masses or fissures. Stool color is brown with no overt blood.  Musculoskeletal: Normal range of motion.  Neurological: She is alert and oriented to person, place, and time.  Skin: Skin is warm and dry. Capillary refill takes less than 2 seconds.  Psychiatric: She has a normal mood and affect. Her speech is normal.  Nursing note and vitals reviewed.    ED Treatments / Results  Labs (all labs ordered are listed, but only abnormal results are displayed) Labs Reviewed  COMPREHENSIVE METABOLIC PANEL - Abnormal; Notable for the following components:      Result Value   Glucose, Bld 127 (*)    Creatinine, Ser 1.93 (*)    Albumin 3.3 (*)  GFR calc non Af Amer 23 (*)    GFR calc Af Amer 26 (*)    All other components within normal limits  CBC - Abnormal; Notable for the following components:   RBC 3.86 (*)    Hemoglobin 6.2 (*)    HCT 23.7 (*)    MCV 61.4 (*)    MCH 16.1 (*)    MCHC 26.2 (*)    RDW 20.9 (*)    All other components within normal limits  VITAMIN B12  FOLATE  IRON AND TIBC  FERRITIN  RETICULOCYTES  POC OCCULT BLOOD, ED  TYPE AND SCREEN  PREPARE RBC (CROSSMATCH)    EKG EKG Interpretation  Date/Time:  Wednesday June 16 2018 12:59:20 EDT Ventricular Rate:  78 PR Interval:  132 QRS Duration: 122 QT Interval:  426 QTC Calculation: 485 R Axis:   95 Text Interpretation:  Normal sinus rhythm Right bundle branch block Abnormal ECG Nonspecific ST and T wave abnormality No significant change since last tracing Confirmed by Varney Biles [phone]) on 06/16/2018 3:17:34 PM   Radiology No results found.  Procedures .Critical  Care Performed by: Volanda Napoleon, PA-C Authorized by: Volanda Napoleon, PA-C   Critical care provider statement:    Critical care time (minutes):  45   Critical care was necessary to treat or prevent imminent or life-threatening deterioration of the following conditions:  Cardiac failure, circulatory failure and respiratory failure   Critical care was time spent personally by me on the following activities:  Discussions with consultants, evaluation of patient's response to treatment, examination of patient, ordering and performing treatments and interventions, ordering and review of laboratory studies, ordering and review of radiographic studies, pulse oximetry, re-evaluation of patient's condition, obtaining history from patient or surrogate and review of old charts   (including critical care time)  Medications Ordered in ED Medications  0.9 %  sodium chloride infusion (has no administration in time range)     Initial Impression / Assessment and Plan / ED Course  I have reviewed the triage vital signs and the nursing notes.  Pertinent labs & imaging results that were available during my care of the patient were reviewed by me and considered in my medical decision making (see chart for details).     84 year old female presents for evaluation of anemia.  No black or tarry stools or melena noted.  Does have a history of GI bleed.  Complains of some recent shortness of breath and fatigue.  No complains of chest pain or abdominal pain at this time. Patient is afebrile, non-toxic appearing, sitting comfortably on examination table. Vital signs reviewed and stable.  On exam, she has pale conjunctivae bilaterally.  Digital rectal exam reveals no gross melena.  Stool is brown in color.  Concern for recurring GI bleed versus symptomatic anemia.  Initial labs ordered at triage.    Hemoglobin is 6.2.  MCV 61.4.  Platelets are 192.  Creatinine is slightly elevated at 1.93.  Appears her baseline is  between 1.7-1.8.  Patient is fecal occult negative.  Symptomatic anemia, transfusion started here in the ED.  Concern as to why patient is anemic and she has no evidence of GI bleed here in the ED.  Additionally, given symptom medic anemia, feel that admission is best.  Discussed with Dr. Terrence Dupont.  He accepts patient for admission.  He asked that I consult GI for their recommendations.  Discussed with Dr. Vaughan Browner (GI).  Agrees with transfusion.  Given negative fecal occult, no  indication for any further GI intervention but be available for consult needed.  Final Clinical Impressions(s) / ED Diagnoses   Final diagnoses:  Symptomatic anemia    ED Discharge Orders    None       Volanda Napoleon, PA-C 06/16/18 [place], Ankit, MD 06/18/18 1545

## 2018-06-17 ENCOUNTER — Observation Stay (HOSPITAL_COMMUNITY): Payer: Medicare HMO

## 2018-06-17 LAB — IRON AND TIBC
Iron: 213 ug/dL — ABNORMAL HIGH (ref 28–170)
Saturation Ratios: 62 % — ABNORMAL HIGH (ref 10.4–31.8)
TIBC: 346 ug/dL (ref 250–450)
UIBC: 133 ug/dL

## 2018-06-17 LAB — BASIC METABOLIC PANEL
Anion gap: 7 (ref 5–15)
BUN: 17 mg/dL (ref 8–23)
CALCIUM: 8.6 mg/dL — AB (ref 8.9–10.3)
CHLORIDE: 111 mmol/L (ref 98–111)
CO2: 22 mmol/L (ref 22–32)
CREATININE: 1.65 mg/dL — AB (ref 0.44–1.00)
GFR calc non Af Amer: 27 mL/min — ABNORMAL LOW (ref >60)
GFR, EST AFRICAN AMERICAN: 32 mL/min — AB (ref >60)
Glucose, Bld: 90 mg/dL (ref 70–99)
Potassium: 4.2 mmol/L (ref 3.5–5.1)
SODIUM: 140 mmol/L (ref 135–145)

## 2018-06-17 LAB — CBC
HCT: 27 % — ABNORMAL LOW (ref 36.0–46.0)
Hemoglobin: 8 g/dL — ABNORMAL LOW (ref 12.0–15.0)
MCH: 19.7 pg — ABNORMAL LOW (ref 26.0–34.0)
MCHC: 29.6 g/dL — ABNORMAL LOW (ref 30.0–36.0)
MCV: 66.3 fL — AB (ref 78.0–100.0)
PLATELETS: 181 10*3/uL (ref 150–400)
RBC: 4.07 MIL/uL (ref 3.87–5.11)
RDW: 28 % — AB (ref 11.5–15.5)
WBC: 5.9 10*3/uL (ref 4.0–10.5)

## 2018-06-17 LAB — GLUCOSE, CAPILLARY: GLUCOSE-CAPILLARY: 81 mg/dL (ref 70–99)

## 2018-06-17 LAB — FERRITIN: Ferritin: 11 ng/mL (ref 11–307)

## 2018-06-17 LAB — RETICULOCYTES
RBC.: 4.07 MIL/uL (ref 3.87–5.11)
RETIC COUNT ABSOLUTE: 20.4 10*3/uL (ref 19.0–186.0)
Retic Ct Pct: 0.5 % (ref 0.4–3.1)

## 2018-06-17 LAB — HEMOGLOBIN A1C
HEMOGLOBIN A1C: 6 % — AB (ref 4.8–5.6)
Mean Plasma Glucose: 125.5 mg/dL

## 2018-06-17 LAB — VITAMIN B12: VITAMIN B 12: 147 pg/mL — AB (ref 180–914)

## 2018-06-17 LAB — FOLATE: Folate: 14.8 ng/mL (ref >5.9)

## 2018-06-17 MED ORDER — FERROUS SULFATE 325 (65 FE) MG PO TABS: 325.0000 mg | ORAL_TABLET | Freq: Three times a day (TID) | ORAL | 3 refills | Status: DC

## 2018-06-17 MED ORDER — FERROUS SULFATE 325 (65 FE) MG PO TABS: 325.0000 mg | ORAL_TABLET | Freq: Three times a day (TID) | ORAL | Status: DC

## 2018-06-17 NOTE — Progress Notes (Signed)
Phlebotomy notified for blood draw on previous labs due. 2 hours past blood transfusion.

## 2018-06-17 NOTE — Discharge Summary (Signed)
NAME: DAGMAR, ADCOX MEDICAL RECORD [id number] ACCOUNT [id number] DATE OF BIRTH:05/16/1931 FACILITY: [facility] LOCATION: [facility] PHYSICIAN:Cadarius Nevares Daivd Council, MD  DISCHARGE SUMMARY  DATE OF DISCHARGE:  06/17/2018  DATE OF ADMISSION:  06/16/2018.  DATE OF DISCHARGE:  06/17/2018.  ADMITTING DIAGNOSES: 1.  Acute on chronic microcytic hypochromic symptomatic anemia, rule out gastrointestinal loss. 2.  Coronary artery disease, history of coronary artery bypass grafting x2 in the past. 3.  Hypertension. 4.  Diabetes mellitus. 5.  Hyperlipidemia. 6.  History of questionable transient ischemia attack in the past. 7.  Degenerative joint disease. 8.  Acute on chronic kidney injury.  FINAL DIAGNOSES: 1.  Status post acute on chronic microcytic hypochromic spontaneous his symptomatic anemia, status post packed RBC transfusion with appropriate increase in her hemoglobin. 2.  Coronary artery disease, history of coronary artery bypass grafting x2 in the past. 3.  Hypertension. 4.  Type 2 diabetes mellitus, controlled by diet. 5.  Hyperlipidemia. 6.  History of questionable transient ischemic attack in the past. 7.  Degenerative joint disease. 8.  Resolving acute kidney injury.  DISCHARGE HOME MEDICATIONS:   1.  Amlodipine 2.5 mg 1 tablet daily. 2.  Isosorbide mononitrate 30 mg daily. 3.  Metoprolol succinate 50 mg daily. 4.  Nitrostat sublingual p.r.n. 5.  Omeprazole 40 mg daily. 6.  Zofran 4 mg every 8 hours as needed. 7.  The patient has been advised to stop aspirin for now. 8.  MiraLax 17 grams daily as needed.  DIET:  Low salt, low cholesterol 1800 calories ADA diet.  CONDITION AT DISCHARGE:  Stable.  FOLLOWUP:  Follow up with me in 1 week.  Follow up with GI as scheduled.  CONDITION AT DISCHARGE:  Stable.  BRIEF HISTORY AND HOSPITAL COURSE:  The patient is an 84 year old female with past medical history significant for coronary artery disease status post CABG x2 in 1981 and  subsequently had a redo CABG in 2001, hypertension, diabetes mellitus controlled by  diet, hyperlipidemia, history of questionable TIA in the past, chronic microcytic anemia, history of lower GI bleed in the past.  She came to the ER complaining of feeling tired, fatigued, no energy associated with shortness of breath with minimal  exertion.  The patient was noted to have hemoglobin of 6.2.  The patient was seen by PCP yesterday with similar complaints and CBC drawn as outpatient and was referred to the ED for further workup.  The patient denies any abdominal pain.  Denies any  tarry stools.  Denies any bright red blood per rectum.  Denies hematemesis.  Denies any NSAID abuse.  PHYSICAL EXAMINATION: GENERAL:  She was alert, awake, oriented x3. VITAL SIGNS:  Blood pressure was 153/52, pulse is 67.  She was afebrile. HEENT:  Conjunctivae pale.  Sclerae are nonicteric. NECK:  Supple, no JVD, no bruit. LUNGS:  Clear to auscultation without rhonchi or rales. CARDIOVASCULAR:  S1, S2 was normal.  There was II/VI systolic murmur. ABDOMEN:  Soft.  Bowel sounds present, nontender. EXTREMITIES:  There is no clubbing, cyanosis or edema.  LABORATORY DATA:  Sodium was 141, potassium 4.1, BUN was 19, creatinine 1.93.  Repeat BUN today is 17, creatinine 1.65, which is trending down.  Hemoglobin yesterday was 6.2, hematocrit 23.7, white count of 5.3.  Today hemoglobin is 8.0, hematocrit 27.0,  white count of 5.9.  Anemia panel is pending.  Fecal occult blood was negative x2.  BRIEF HOSPITAL COURSE:  The patient was admitted to regular floor.  The patient received two units of  packed RBCs during the hospital stay.  The patient did not have any episodes of GI bleed during the hospital stay.  Her hemoglobin appropriately  increased after packed RBC transfusion.  The patient has been ambulating in the room without any problems.  The patient had extensive GI workup in the past with no obvious source of chronic  microcytic anemia.  The patient will be followed up as  outpatient by GI and may need a repeat upper endoscopy or capsule study as appropriate as outpatient.  DISCHARGE INSTRUCTIONS:  The patient has been advised to stop aspirin for now.  The patient has been advised to start iron pills as per orders.  The patient will be followed up in my office in one week and GI as scheduled.  TN/NUANCE D:06/17/2018 T:06/17/2018 JOB:[id number]

## 2018-06-17 NOTE — Discharge Summary (Signed)
Discharge summary dictated on 06/17/2018, dictation number is [phone]

## 2018-06-17 NOTE — Discharge Instructions (Signed)
Anemia Anemia is a condition in which you do not have enough red blood cells or hemoglobin. Hemoglobin is a substance in red blood cells that carries oxygen. When you do not have enough red blood cells or hemoglobin (are anemic), your body cannot get enough oxygen and your organs may not work properly. As a result, you may feel very tired or have other problems. What are the causes? Common causes of anemia include:  Excessive bleeding. Anemia can be caused by excessive bleeding inside or outside the body, including bleeding from the intestine or from periods in women.  Poor nutrition.  Long-lasting (chronic) kidney, thyroid, and liver disease.  Bone marrow disorders.  Cancer and treatments for cancer.  HIV (human immunodeficiency virus) and AIDS (acquired immunodeficiency syndrome).  Treatments for HIV and AIDS.  Spleen problems.  Blood disorders.  Infections, medicines, and autoimmune disorders that destroy red blood cells.  What are the signs or symptoms? Symptoms of this condition include:  Minor weakness.  Dizziness.  Headache.  Feeling heartbeats that are irregular or faster than normal (palpitations).  Shortness of breath, especially with exercise.  Paleness.  Cold sensitivity.  Indigestion.  Nausea.  Difficulty sleeping.  Difficulty concentrating.  Symptoms may occur suddenly or develop slowly. If your anemia is mild, you may not have symptoms. How is this diagnosed? This condition is diagnosed based on:  Blood tests.  Your medical history.  A physical exam.  Bone marrow biopsy.  Your health care provider may also check your stool (feces) for blood and may do additional testing to look for the cause of your bleeding. You may also have other tests, including:  Imaging tests, such as a CT scan or MRI.  Endoscopy.  Colonoscopy.  How is this treated? Treatment for this condition depends on the cause. If you continue to lose a lot of blood,  you may need to be treated at a hospital. Treatment may include:  Taking supplements of iron, vitamin T02, or folic acid.  Taking a hormone medicine (erythropoietin) that can help to stimulate red blood cell growth.  Having a blood transfusion. This may be needed if you lose a lot of blood.  Making changes to your diet.  Having surgery to remove your spleen.  Follow these instructions at home:  Take over-the-counter and prescription medicines only as told by your health care provider.  Take supplements only as told by your health care provider.  Follow any diet instructions that you were given.  Keep all follow-up visits as told by your health care provider. This is important. Contact a health care provider if:  You develop new bleeding anywhere in the body. Get help right away if:  You are very weak.  You are short of breath.  You have pain in your abdomen or chest.  You are dizzy or feel faint.  You have trouble concentrating.  You have bloody or black, tarry stools.  You vomit repeatedly or you vomit up blood. Summary  Anemia is a condition in which you do not have enough red blood cells or enough of a substance in your red blood cells that carries oxygen (hemoglobin).  Symptoms may occur suddenly or develop slowly.  If your anemia is mild, you may not have symptoms.  This condition is diagnosed with blood tests as well as a medical history and physical exam. Other tests may be needed.  Treatment for this condition depends on the cause of the anemia. This information is not intended to replace advice  given to you by your health care provider. Make sure you discuss any questions you have with your health care provider. Document Released: 10/16/2004 Document Revised: 10/10/2016 Document Reviewed: 10/10/2016 Elsevier Interactive Patient Education  [employer].

## 2018-06-18 LAB — TYPE AND SCREEN
ABO/RH(D): O POS
Antibody Screen: NEGATIVE
UNIT DIVISION: 0
UNIT DIVISION: 0
Unit division: 0

## 2018-06-18 LAB — BPAM RBC
BLOOD PRODUCT EXPIRATION DATE: 201910212359
BLOOD PRODUCT EXPIRATION DATE: 201910232359
Blood Product Expiration Date: 201910212359
ISSUE DATE / TIME: 201909251524
ISSUE DATE / TIME: 201909252106
UNIT TYPE AND RH: 5100
Unit Type and Rh: 5100
Unit Type and Rh: 5100

## 2018-06-30 ENCOUNTER — Encounter (HOSPITAL_COMMUNITY): Payer: Self-pay | Admitting: Emergency Medicine

## 2018-06-30 ENCOUNTER — Emergency Department (HOSPITAL_COMMUNITY): Payer: Medicare HMO

## 2018-06-30 ENCOUNTER — Other Ambulatory Visit: Payer: Self-pay

## 2018-06-30 ENCOUNTER — Emergency Department (HOSPITAL_COMMUNITY)
Admission: EM | Admit: 2018-06-30 | Discharge: 2018-07-01 | Disposition: A | Discharge status: Home / Self Care | Payer: Medicare HMO | Attending: Emergency Medicine | Admitting: Emergency Medicine

## 2018-06-30 DIAGNOSIS — I2510 Atherosclerotic heart disease of native coronary artery without angina pectoris: Secondary | ICD-10-CM | POA: Insufficient documentation

## 2018-06-30 DIAGNOSIS — Z79899 Other long term (current) drug therapy: Secondary | ICD-10-CM | POA: Diagnosis not present

## 2018-06-30 DIAGNOSIS — Z87891 Personal history of nicotine dependence: Secondary | ICD-10-CM | POA: Insufficient documentation

## 2018-06-30 DIAGNOSIS — M4802 Spinal stenosis, cervical region: Secondary | ICD-10-CM | POA: Diagnosis present

## 2018-06-30 DIAGNOSIS — I6523 Occlusion and stenosis of bilateral carotid arteries: Secondary | ICD-10-CM | POA: Diagnosis not present

## 2018-06-30 DIAGNOSIS — I10 Essential (primary) hypertension: Secondary | ICD-10-CM | POA: Insufficient documentation

## 2018-06-30 DIAGNOSIS — E119 Type 2 diabetes mellitus without complications: Secondary | ICD-10-CM | POA: Insufficient documentation

## 2018-06-30 DIAGNOSIS — K1370 Unspecified lesions of oral mucosa: Secondary | ICD-10-CM | POA: Diagnosis present

## 2018-06-30 DIAGNOSIS — Z8673 Personal history of transient ischemic attack (TIA), and cerebral infarction without residual deficits: Secondary | ICD-10-CM | POA: Insufficient documentation

## 2018-06-30 DIAGNOSIS — Z951 Presence of aortocoronary bypass graft: Secondary | ICD-10-CM | POA: Insufficient documentation

## 2018-06-30 DIAGNOSIS — K1379 Other lesions of oral mucosa: Secondary | ICD-10-CM

## 2018-06-30 LAB — CBC WITH DIFFERENTIAL/PLATELET
Abs Immature Granulocytes: 0.01 10*3/uL (ref 0.00–0.07)
BASOS ABS: 0 10*3/uL (ref 0.0–0.1)
Basophils Relative: 1 %
EOS ABS: 0.1 10*3/uL (ref 0.0–0.5)
Eosinophils Relative: 2 %
HCT: 38.6 % (ref 36.0–46.0)
Hemoglobin: 10.6 g/dL — ABNORMAL LOW (ref 12.0–15.0)
IMMATURE GRANULOCYTES: 0 %
Lymphocytes Relative: 21 %
Lymphs Abs: 1.3 10*3/uL (ref 0.7–4.0)
MCH: 18.4 pg — ABNORMAL LOW (ref 26.0–34.0)
MCHC: 27.5 g/dL — ABNORMAL LOW (ref 30.0–36.0)
MCV: 67.1 fL — AB (ref 80.0–100.0)
MONOS PCT: 7 %
Monocytes Absolute: 0.4 10*3/uL (ref 0.1–1.0)
NEUTROS ABS: 4.1 10*3/uL (ref 1.7–7.7)
NEUTROS PCT: 69 %
NRBC: 0 % (ref 0.0–0.2)
PLATELETS: 163 10*3/uL (ref 150–400)
RBC: 5.75 MIL/uL — AB (ref 3.87–5.11)
RDW: 28.8 % — AB (ref 11.5–15.5)
WBC: 5.9 10*3/uL (ref 4.0–10.5)

## 2018-06-30 LAB — BASIC METABOLIC PANEL
ANION GAP: 12 (ref 5–15)
BUN: 17 mg/dL (ref 8–23)
CO2: 21 mmol/L — ABNORMAL LOW (ref 22–32)
Calcium: 9.8 mg/dL (ref 8.9–10.3)
Chloride: 105 mmol/L (ref 98–111)
Creatinine, Ser: 1.48 mg/dL — ABNORMAL HIGH (ref 0.44–1.00)
GFR, EST AFRICAN AMERICAN: 36 mL/min — AB (ref >60)
GFR, EST NON AFRICAN AMERICAN: 31 mL/min — AB (ref >60)
GLUCOSE: 161 mg/dL — AB (ref 70–99)
POTASSIUM: 3.9 mmol/L (ref 3.5–5.1)
Sodium: 138 mmol/L (ref 135–145)

## 2018-06-30 LAB — I-STAT TROPONIN, ED: TROPONIN I, POC: 0.01 ng/mL (ref 0.00–0.08)

## 2018-06-30 MED ORDER — IOHEXOL 300 MG/ML  SOLN
75.0000 mL | Freq: Once | INTRAMUSCULAR | Status: AC | PRN
  Administered 2018-06-30: 75 mL via INTRAVENOUS

## 2018-06-30 NOTE — ED Triage Notes (Signed)
Pt reports difficulty swallowing that started last night. Pt reports her tongue is turning black and reports pain to tongue, throat and both ears. Reports difficulty eating and swallowing. Also runny nose and productive cough. Pt reports sob, denies feeling like her throat is closing up. Respirations Hx of HTN, is compliant with medications. A&O to self and situation, per family is baseline when pt gets asked repetitive questions. No neuro deficits noted.
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
## 2018-06-30 NOTE — ED Provider Notes (Signed)
MSE was initiated and I personally evaluated the patient and placed orders (if any) at  6:13 PM on June 30, 2018.  The patient appears stable so that the remainder of the MSE may be completed by another provider.  Patient placed in Quick Look pathway, seen and evaluated   Chief Complaint: dysphagia  HPI:   84 year old female with a past medical history of CAD, diabetes, hyperlipidemia, hypertension presents to ED for evaluation of 24-hour history of nausea, pain in her tongue, feeling like her tongue is turning black.  Patient is unwilling to provide much of a history.  States that she is having pain in her mouth worse when she tries to open her mouth.  She also reports pain in her neck.  She is unsure what caused this.  Reports shortness of breath but denies any chest pain.  No history of similar symptoms in the past.  ROS: Dysphasia (one)  Physical Exam:   Gen: No distress  Neuro: Awake and Alert  Skin: Warm    Focused Exam: Trismus noted. Patient tearful, unwilling to stick tongue out. TTP of jaw   Initiation of care has begun. The patient has been counseled on the process, plan, and necessity for staying for the completion/evaluation, and the remainder of the medical screening examination    Delia Heady, PA-C 06/30/18 [place], Dan, DO 07/01/18 0003

## 2018-07-01 DIAGNOSIS — M4802 Spinal stenosis, cervical region: Secondary | ICD-10-CM | POA: Diagnosis present

## 2018-07-01 DIAGNOSIS — I6523 Occlusion and stenosis of bilateral carotid arteries: Secondary | ICD-10-CM | POA: Diagnosis present

## 2018-07-01 MED ORDER — ACETAMINOPHEN 500 MG PO TABS
1000.0000 mg | ORAL_TABLET | Freq: Once | ORAL | Status: AC
  Administered 2018-07-01: 1000 mg via ORAL
  Filled 2018-07-01: qty 2

## 2018-07-01 NOTE — ED Provider Notes (Signed)
Medical screening examination/treatment/procedure(s) were conducted as a shared visit with non-physician practitioner(s) and myself.  I personally evaluated the patient during the encounter.  EKG Interpretation  Date/Time:  Wednesday June 30 2018 18:06:07 EDT Ventricular Rate:  70 PR Interval:  136 QRS Duration: 118 QT Interval:  424 QTC Calculation: 457 R Axis:   104 Text Interpretation:  Normal sinus rhythm Right bundle branch block Abnormal ECG No significant change since last tracing Confirmed by Pryor Curia [id number]) on 07/01/2018 12:15:09 AM  Pt is a 84 y.o. female who presents to the emergency department with mouth pain.  States that she is having pain with eating.  No difficulty swallowing, speaking or breathing.  No chest pain or shortness of breath.  She has dentures in place.  No injury to the mouth.  On examination, patient does have a small aphthous ulcer to the left lower inside lip without signs of infection.  She is able to remove her lower dentures and has tenderness along the gumline with no inflammation.  No glandular swelling, enlarged lymph nodes on examination.  No trismus or drooling.  No stridor.  No normal phonation.  Work-up here has been unremarkable.  I do not feel this is her anginal equivalent.  EKG shows no new ischemic abnormality and troponin negative.  Chest x-ray clear.  CT done of her neck which shows right sided submandibular stones but no signs of sialoadenitis.  I doubt this is the cause of her pain.  Seems to be pain along the gumline which could be from her dentures.  Recommended Tylenol for pain control, Orajel over-the-counter for her aphthous ulcer.  Recommended soft diet for the next several days.  We discussed return precautions and importance of PCP follow-up if symptoms are not improving.   [place], Delice Bison, DO 07/01/18 [phone]

## 2018-07-01 NOTE — Discharge Instructions (Addendum)
Please take 1,000 mg of tylenol (two extra strength tablets) as needed for pain every 6 hours.  You may also use Orajel which is available over-the-counter.  Please make sure that you are staying well-hydrated and eating enough food.  Please follow a soft diet until you start feeling better.    Please follow up with your doctor about your ED visit, CT scan results.

## 2019-12-31 ENCOUNTER — Ambulatory Visit: Payer: Medicare (Managed Care) | Attending: Internal Medicine

## 2019-12-31 DIAGNOSIS — Z23 Encounter for immunization: Secondary | ICD-10-CM

## 2019-12-31 NOTE — Progress Notes (Signed)
   Covid-19 Vaccination Clinic  Name:  [name]    MRN: [id number] DOB: 25-May-1931  12/31/2019  [name] was observed post Covid-19 immunization for 15 minutes without incident. She was provided with Vaccine Information Sheet and instruction to access the V-Safe system.   [name] was instructed to call 911 with any severe reactions post vaccine: [name] Difficulty breathing  . Swelling of face and throat  . A fast heartbeat  . A bad rash all over body  . Dizziness and weakness   Immunizations Administered    Name Date Dose VIS Date Route   Pfizer COVID-19 Vaccine 12/31/2019  2:41 PM 0.3 mL 09/02/2019 Intramuscular   Manufacturer: [place]   Lot: [id number]   [place]: [id number]

## 2020-01-25 ENCOUNTER — Ambulatory Visit: Payer: Medicare (Managed Care) | Attending: Internal Medicine

## 2020-01-25 DIAGNOSIS — Z23 Encounter for immunization: Secondary | ICD-10-CM

## 2020-01-25 NOTE — Progress Notes (Signed)
   Covid-19 Vaccination Clinic  Name:  [name]    MRN: [id number] DOB: 1931/09/13  01/25/2020  [name] was observed post Covid-19 immunization for 15 minutes without incident. She was provided with Vaccine Information Sheet and instruction to access the V-Safe system.   [name] was instructed to call 911 with any severe reactions post vaccine: [name] Difficulty breathing  . Swelling of face and throat  . A fast heartbeat  . A bad rash all over body  . Dizziness and weakness   Immunizations Administered    Name Date Dose VIS Date Route   Pfizer COVID-19 Vaccine 01/25/2020  4:10 PM 0.3 mL 11/16/2018 Intramuscular   Manufacturer: [place]   Lot: [id number]   [place]: [id number]

## 2021-02-10 ENCOUNTER — Encounter (HOSPITAL_COMMUNITY): Payer: Self-pay

## 2021-02-10 ENCOUNTER — Emergency Department (HOSPITAL_COMMUNITY): Payer: Medicare HMO

## 2021-02-10 ENCOUNTER — Inpatient Hospital Stay (HOSPITAL_COMMUNITY)
Admission: EM | Admit: 2021-02-10 | Discharge: 2021-02-18 | DRG: 378 | Disposition: A | Discharge status: Hospice | Payer: Medicare HMO | Attending: Internal Medicine | Admitting: Internal Medicine

## 2021-02-10 ENCOUNTER — Other Ambulatory Visit: Payer: Self-pay

## 2021-02-10 DIAGNOSIS — Z955 Presence of coronary angioplasty implant and graft: Secondary | ICD-10-CM

## 2021-02-10 DIAGNOSIS — R627 Adult failure to thrive: Secondary | ICD-10-CM | POA: Diagnosis present

## 2021-02-10 DIAGNOSIS — E1122 Type 2 diabetes mellitus with diabetic chronic kidney disease: Secondary | ICD-10-CM | POA: Diagnosis present

## 2021-02-10 DIAGNOSIS — D649 Anemia, unspecified: Secondary | ICD-10-CM | POA: Diagnosis present

## 2021-02-10 DIAGNOSIS — I129 Hypertensive chronic kidney disease with stage 1 through stage 4 chronic kidney disease, or unspecified chronic kidney disease: Secondary | ICD-10-CM | POA: Diagnosis present

## 2021-02-10 DIAGNOSIS — Z66 Do not resuscitate: Secondary | ICD-10-CM | POA: Diagnosis present

## 2021-02-10 DIAGNOSIS — Z7982 Long term (current) use of aspirin: Secondary | ICD-10-CM

## 2021-02-10 DIAGNOSIS — Z7189 Other specified counseling: Secondary | ICD-10-CM | POA: Diagnosis not present

## 2021-02-10 DIAGNOSIS — E785 Hyperlipidemia, unspecified: Secondary | ICD-10-CM | POA: Diagnosis present

## 2021-02-10 DIAGNOSIS — Z20822 Contact with and (suspected) exposure to covid-19: Secondary | ICD-10-CM | POA: Diagnosis present

## 2021-02-10 DIAGNOSIS — I10 Essential (primary) hypertension: Secondary | ICD-10-CM | POA: Diagnosis not present

## 2021-02-10 DIAGNOSIS — Z515 Encounter for palliative care: Secondary | ICD-10-CM

## 2021-02-10 DIAGNOSIS — N1832 Chronic kidney disease, stage 3b: Secondary | ICD-10-CM | POA: Diagnosis present

## 2021-02-10 DIAGNOSIS — Z87891 Personal history of nicotine dependence: Secondary | ICD-10-CM | POA: Diagnosis not present

## 2021-02-10 DIAGNOSIS — E11649 Type 2 diabetes mellitus with hypoglycemia without coma: Secondary | ICD-10-CM | POA: Diagnosis present

## 2021-02-10 DIAGNOSIS — Z781 Physical restraint status: Secondary | ICD-10-CM

## 2021-02-10 DIAGNOSIS — F0390 Unspecified dementia without behavioral disturbance: Secondary | ICD-10-CM | POA: Diagnosis present

## 2021-02-10 DIAGNOSIS — A419 Sepsis, unspecified organism: Secondary | ICD-10-CM | POA: Diagnosis not present

## 2021-02-10 DIAGNOSIS — D72829 Elevated white blood cell count, unspecified: Secondary | ICD-10-CM | POA: Diagnosis present

## 2021-02-10 DIAGNOSIS — Z90710 Acquired absence of both cervix and uterus: Secondary | ICD-10-CM

## 2021-02-10 DIAGNOSIS — B9689 Other specified bacterial agents as the cause of diseases classified elsewhere: Secondary | ICD-10-CM | POA: Diagnosis present

## 2021-02-10 DIAGNOSIS — K922 Gastrointestinal hemorrhage, unspecified: Secondary | ICD-10-CM | POA: Diagnosis present

## 2021-02-10 DIAGNOSIS — K2950 Unspecified chronic gastritis without bleeding: Secondary | ICD-10-CM | POA: Diagnosis present

## 2021-02-10 DIAGNOSIS — N179 Acute kidney failure, unspecified: Secondary | ICD-10-CM | POA: Diagnosis present

## 2021-02-10 DIAGNOSIS — Z951 Presence of aortocoronary bypass graft: Secondary | ICD-10-CM | POA: Diagnosis not present

## 2021-02-10 DIAGNOSIS — N390 Urinary tract infection, site not specified: Secondary | ICD-10-CM

## 2021-02-10 DIAGNOSIS — I2510 Atherosclerotic heart disease of native coronary artery without angina pectoris: Secondary | ICD-10-CM | POA: Diagnosis present

## 2021-02-10 DIAGNOSIS — E876 Hypokalemia: Secondary | ICD-10-CM | POA: Diagnosis present

## 2021-02-10 DIAGNOSIS — N1831 Chronic kidney disease, stage 3a: Secondary | ICD-10-CM | POA: Diagnosis not present

## 2021-02-10 DIAGNOSIS — D62 Acute posthemorrhagic anemia: Secondary | ICD-10-CM | POA: Diagnosis present

## 2021-02-10 DIAGNOSIS — E118 Type 2 diabetes mellitus with unspecified complications: Secondary | ICD-10-CM | POA: Diagnosis present

## 2021-02-10 DIAGNOSIS — Z79899 Other long term (current) drug therapy: Secondary | ICD-10-CM

## 2021-02-10 DIAGNOSIS — N183 Chronic kidney disease, stage 3 unspecified: Secondary | ICD-10-CM | POA: Diagnosis present

## 2021-02-10 DIAGNOSIS — R531 Weakness: Secondary | ICD-10-CM | POA: Diagnosis not present

## 2021-02-10 DIAGNOSIS — K5730 Diverticulosis of large intestine without perforation or abscess without bleeding: Secondary | ICD-10-CM | POA: Diagnosis present

## 2021-02-10 DIAGNOSIS — Z8673 Personal history of transient ischemic attack (TIA), and cerebral infarction without residual deficits: Secondary | ICD-10-CM | POA: Diagnosis not present

## 2021-02-10 DIAGNOSIS — R109 Unspecified abdominal pain: Secondary | ICD-10-CM | POA: Diagnosis present

## 2021-02-10 LAB — CBC WITH DIFFERENTIAL/PLATELET
Abs Immature Granulocytes: 0.11 10*3/uL — ABNORMAL HIGH (ref 0.00–0.07)
Basophils Absolute: 0 10*3/uL (ref 0.0–0.1)
Basophils Relative: 0 %
Eosinophils Absolute: 0 10*3/uL (ref 0.0–0.5)
Eosinophils Relative: 0 %
HCT: 22.4 % — ABNORMAL LOW (ref 36.0–46.0)
Hemoglobin: 6.7 g/dL — CL (ref 12.0–15.0)
Immature Granulocytes: 1 %
Lymphocytes Relative: 10 %
Lymphs Abs: 1.3 10*3/uL (ref 0.7–4.0)
MCH: 19.2 pg — ABNORMAL LOW (ref 26.0–34.0)
MCHC: 29.9 g/dL — ABNORMAL LOW (ref 30.0–36.0)
MCV: 64.2 fL — ABNORMAL LOW (ref 80.0–100.0)
Monocytes Absolute: 0.8 10*3/uL (ref 0.1–1.0)
Monocytes Relative: 6 %
Neutro Abs: 11.4 10*3/uL — ABNORMAL HIGH (ref 1.7–7.7)
Neutrophils Relative %: 83 %
Platelets: 181 10*3/uL (ref 150–400)
RBC: 3.49 MIL/uL — ABNORMAL LOW (ref 3.87–5.11)
RDW: 23 % — ABNORMAL HIGH (ref 11.5–15.5)
WBC: 13.6 10*3/uL — ABNORMAL HIGH (ref 4.0–10.5)
nRBC: 0 % (ref 0.0–0.2)

## 2021-02-10 LAB — COMPREHENSIVE METABOLIC PANEL
ALT: 12 U/L (ref 0–44)
AST: 46 U/L — ABNORMAL HIGH (ref 15–41)
Albumin: 2.5 g/dL — ABNORMAL LOW (ref 3.5–5.0)
Alkaline Phosphatase: 60 U/L (ref 38–126)
Anion gap: 8 (ref 5–15)
BUN: 38 mg/dL — ABNORMAL HIGH (ref 8–23)
CO2: 22 mmol/L (ref 22–32)
Calcium: 8 mg/dL — ABNORMAL LOW (ref 8.9–10.3)
Chloride: 110 mmol/L (ref 98–111)
Creatinine, Ser: 2.03 mg/dL — ABNORMAL HIGH (ref 0.44–1.00)
GFR, Estimated: 23 mL/min — ABNORMAL LOW (ref >60)
Glucose, Bld: 90 mg/dL (ref 70–99)
Potassium: 4.7 mmol/L (ref 3.5–5.1)
Sodium: 140 mmol/L (ref 135–145)
Total Bilirubin: 0.9 mg/dL (ref 0.3–1.2)
Total Protein: 6.6 g/dL (ref 6.5–8.1)

## 2021-02-10 LAB — URINALYSIS, ROUTINE W REFLEX MICROSCOPIC
Bilirubin Urine: NEGATIVE
Glucose, UA: NEGATIVE mg/dL
Ketones, ur: NEGATIVE mg/dL
Nitrite: NEGATIVE
Protein, ur: 30 mg/dL — AB
Specific Gravity, Urine: 1.016 (ref 1.005–1.030)
pH: 5 (ref 5.0–8.0)

## 2021-02-10 LAB — PREPARE RBC (CROSSMATCH)

## 2021-02-10 LAB — LACTIC ACID, PLASMA
Lactic Acid, Venous: 4 mmol/L (ref 0.5–1.9)
Lactic Acid, Venous: 3.8 mmol/L (ref 0.5–1.9)

## 2021-02-10 LAB — POC OCCULT BLOOD, ED: Fecal Occult Bld: POSITIVE — AB

## 2021-02-10 MED ORDER — SODIUM CHLORIDE 0.9 % IV SOLN
1.0000 g | Freq: Once | INTRAVENOUS | Status: AC
  Administered 2021-02-10: 1 g via INTRAVENOUS
  Filled 2021-02-10: qty 10

## 2021-02-10 MED ORDER — SODIUM CHLORIDE 0.9 % IV SOLN: 10.0000 mL/h | Freq: Once | INTRAVENOUS | Status: DC

## 2021-02-10 MED ORDER — SODIUM CHLORIDE 0.9 % IV BOLUS
1000.0000 mL | Freq: Once | INTRAVENOUS | Status: AC
  Administered 2021-02-10: 1000 mL via INTRAVENOUS

## 2021-02-10 MED ORDER — PANTOPRAZOLE SODIUM 40 MG IV SOLR
40.0000 mg | Freq: Once | INTRAVENOUS | Status: AC
  Administered 2021-02-10: 40 mg via INTRAVENOUS
  Filled 2021-02-10: qty 40

## 2021-02-10 MED ORDER — ACETAMINOPHEN 325 MG PO TABS
650.0000 mg | ORAL_TABLET | Freq: Once | ORAL | Status: AC
  Administered 2021-02-10: 650 mg via ORAL
  Filled 2021-02-10: qty 2
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
## 2021-02-10 NOTE — ED Triage Notes (Signed)
Pt BIB EMS from home. Family called EMS for increased weakness and pain all over x3 days. Pt has hx of dementia, and oriented at baseline per family. Pt normally able to get around on her own, but has not been doing so for the past few days.  HR 94 97% RA BP 165/61 CBG 157

## 2021-02-11 DIAGNOSIS — N1831 Chronic kidney disease, stage 3a: Secondary | ICD-10-CM | POA: Diagnosis not present

## 2021-02-11 DIAGNOSIS — A419 Sepsis, unspecified organism: Secondary | ICD-10-CM | POA: Diagnosis not present

## 2021-02-11 DIAGNOSIS — E118 Type 2 diabetes mellitus with unspecified complications: Secondary | ICD-10-CM

## 2021-02-11 DIAGNOSIS — D649 Anemia, unspecified: Secondary | ICD-10-CM | POA: Diagnosis not present

## 2021-02-11 DIAGNOSIS — I10 Essential (primary) hypertension: Secondary | ICD-10-CM

## 2021-02-11 LAB — RESP PANEL BY RT-PCR (FLU A&B, COVID) ARPGX2
Influenza A by PCR: NEGATIVE
Influenza B by PCR: NEGATIVE
SARS Coronavirus 2 by RT PCR: NEGATIVE

## 2021-02-11 LAB — GLUCOSE, CAPILLARY
Glucose-Capillary: 68 mg/dL — ABNORMAL LOW (ref 70–99)
Glucose-Capillary: 104 mg/dL — ABNORMAL HIGH (ref 70–99)
Glucose-Capillary: 69 mg/dL — ABNORMAL LOW (ref 70–99)
Glucose-Capillary: 62 mg/dL — ABNORMAL LOW (ref 70–99)
Glucose-Capillary: 103 mg/dL — ABNORMAL HIGH (ref 70–99)
Glucose-Capillary: 76 mg/dL (ref 70–99)
Glucose-Capillary: 78 mg/dL (ref 70–99)
Glucose-Capillary: 94 mg/dL (ref 70–99)

## 2021-02-11 LAB — CBC
HCT: 24.7 % — ABNORMAL LOW (ref 36.0–46.0)
Hemoglobin: 7.4 g/dL — ABNORMAL LOW (ref 12.0–15.0)
MCH: 21.2 pg — ABNORMAL LOW (ref 26.0–34.0)
MCHC: 30 g/dL (ref 30.0–36.0)
MCV: 70.8 fL — ABNORMAL LOW (ref 80.0–100.0)
Platelets: 167 10*3/uL (ref 150–400)
RBC: 3.49 MIL/uL — ABNORMAL LOW (ref 3.87–5.11)
RDW: 26 % — ABNORMAL HIGH (ref 11.5–15.5)
WBC: 17.7 10*3/uL — ABNORMAL HIGH (ref 4.0–10.5)
nRBC: 0 % (ref 0.0–0.2)

## 2021-02-11 LAB — HEMOGLOBIN A1C
Hgb A1c MFr Bld: 5 % (ref 4.8–5.6)
Mean Plasma Glucose: 96.8 mg/dL

## 2021-02-11 MED ORDER — INSULIN ASPART 100 UNIT/ML IJ SOLN: 0.0000 [IU] | Freq: Every day | INTRAMUSCULAR | Status: DC

## 2021-02-11 MED ORDER — ACETAMINOPHEN 325 MG PO TABS
650.0000 mg | ORAL_TABLET | Freq: Four times a day (QID) | ORAL | Status: DC | PRN
  Administered 2021-02-13 – 2021-02-17 (×4): 650 mg via ORAL
  Filled 2021-02-11 (×8): qty 2

## 2021-02-11 MED ORDER — PANTOPRAZOLE SODIUM 40 MG IV SOLR
40.0000 mg | Freq: Two times a day (BID) | INTRAVENOUS | Status: DC
  Administered 2021-02-11 – 2021-02-16 (×11): 40 mg via INTRAVENOUS
  Filled 2021-02-11 (×12): qty 40

## 2021-02-11 MED ORDER — INSULIN ASPART 100 UNIT/ML IJ SOLN
0.0000 [IU] | Freq: Three times a day (TID) | INTRAMUSCULAR | Status: DC
  Administered 2021-02-13: 2 [IU] via SUBCUTANEOUS
  Administered 2021-02-14: 3 [IU] via SUBCUTANEOUS

## 2021-02-11 MED ORDER — SODIUM CHLORIDE 0.9 % IV SOLN
1.0000 g | INTRAVENOUS | Status: DC
  Administered 2021-02-11: 1 g via INTRAVENOUS
  Filled 2021-02-11: qty 10
  Filled 2021-02-11: qty 1

## 2021-02-11 MED ORDER — DEXTROSE-NACL 5-0.45 % IV SOLN: INTRAVENOUS | Status: DC

## 2021-02-11 MED ORDER — SODIUM CHLORIDE 0.9 % IV SOLN: INTRAVENOUS | Status: DC

## 2021-02-11 MED ORDER — ONDANSETRON HCL 4 MG/2ML IJ SOLN: 4.0000 mg | Freq: Four times a day (QID) | INTRAMUSCULAR | Status: DC | PRN

## 2021-02-11 MED ORDER — ONDANSETRON HCL 4 MG PO TABS: 4.0000 mg | ORAL_TABLET | Freq: Four times a day (QID) | ORAL | Status: DC | PRN

## 2021-02-11 MED ORDER — DEXTROSE 50 % IV SOLN
12.5000 g | INTRAVENOUS | Status: AC
  Administered 2021-02-11: 12.5 g via INTRAVENOUS
  Filled 2021-02-11: qty 50

## 2021-02-11 MED ORDER — ACETAMINOPHEN 650 MG RE SUPP: 650.0000 mg | Freq: Four times a day (QID) | RECTAL | Status: DC | PRN
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
## 2021-02-11 NOTE — Plan of Care (Signed)
  Problem: Safety: Goal: Ability to remain free from injury will improve Outcome: Progressing   Problem: Skin Integrity: Goal: Risk for impaired skin integrity will decrease Outcome: Progressing   

## 2021-02-12 ENCOUNTER — Inpatient Hospital Stay (HOSPITAL_COMMUNITY): Payer: Medicare HMO | Admitting: Anesthesiology

## 2021-02-12 ENCOUNTER — Encounter (HOSPITAL_COMMUNITY)
Admission: EM | Disposition: A | Discharge status: Hospice | Payer: Self-pay | Source: Home / Self Care | Attending: Internal Medicine

## 2021-02-12 DIAGNOSIS — I10 Essential (primary) hypertension: Secondary | ICD-10-CM | POA: Diagnosis not present

## 2021-02-12 DIAGNOSIS — D649 Anemia, unspecified: Secondary | ICD-10-CM | POA: Diagnosis not present

## 2021-02-12 DIAGNOSIS — E785 Hyperlipidemia, unspecified: Secondary | ICD-10-CM | POA: Diagnosis not present

## 2021-02-12 DIAGNOSIS — E118 Type 2 diabetes mellitus with unspecified complications: Secondary | ICD-10-CM | POA: Diagnosis not present

## 2021-02-12 LAB — URINE CULTURE

## 2021-02-12 LAB — CULTURE, BLOOD (ROUTINE X 2): Culture: NO GROWTH

## 2021-02-12 LAB — GLUCOSE, CAPILLARY
Glucose-Capillary: 77 mg/dL (ref 70–99)
Glucose-Capillary: 68 mg/dL — ABNORMAL LOW (ref 70–99)
Glucose-Capillary: 75 mg/dL (ref 70–99)
Glucose-Capillary: 102 mg/dL — ABNORMAL HIGH (ref 70–99)

## 2021-02-12 SURGERY — EGD (ESOPHAGOGASTRODUODENOSCOPY)
Anesthesia: Monitor Anesthesia Care

## 2021-02-12 SURGERY — CANCELLED PROCEDURE
Anesthesia: Monitor Anesthesia Care

## 2021-02-12 MED ORDER — LORAZEPAM 2 MG/ML IJ SOLN
1.0000 mg | Freq: Once | INTRAMUSCULAR | Status: AC
  Administered 2021-02-12: 1 mg via INTRAMUSCULAR
  Filled 2021-02-12: qty 1

## 2021-02-12 MED ORDER — HALOPERIDOL LACTATE 5 MG/ML IJ SOLN
2.0000 mg | Freq: Once | INTRAMUSCULAR | Status: AC
  Administered 2021-02-12: 2 mg via INTRAMUSCULAR
  Filled 2021-02-12: qty 1

## 2021-02-12 MED ORDER — SODIUM CHLORIDE 0.9 % IV SOLN: INTRAVENOUS | Status: DC

## 2021-02-12 SURGICAL SUPPLY — 14 items

## 2021-02-12 NOTE — Significant Event (Addendum)
Rapid Response Event Note   Reason for Call :  Alleged desaturation in the 70's   Initial Focused Assessment:  Upon assessment, pt is taking 16 breaths a minute, unlabored, sufficient depth. Rechecked O2 sat, reading 100% on 10L simple mask, then 100% on RA. BP 121/57 (78), 62 HR,      Interventions: endoscopy skipped, sent back to her room   Plan of Care:  Continue to monitor mentation and oxygen saturation   Event Summary:   MD Notified: [place] Call Time: [place] Time: 1410 End Time: [place]  Clarene Critchley, RN

## 2021-02-12 NOTE — Anesthesia Preprocedure Evaluation (Deleted)
Anesthesia Evaluation  Patient identified by MRN, date of birth, ID band Patient awake    Reviewed: Allergy & Precautions, NPO status , Patient's Chart, lab work & pertinent test results  Airway Mallampati: II  TM Distance: >3 FB Neck ROM: Full    Dental no notable dental hx.    Pulmonary neg pulmonary ROS, former smoker,    Pulmonary exam normal breath sounds clear to auscultation       Cardiovascular hypertension, + CAD, + Cardiac Stents and + CABG  Normal cardiovascular exam Rhythm:Regular Rate:Normal     Neuro/Psych Dementia CVA    GI/Hepatic negative GI ROS, Neg liver ROS,   Endo/Other  diabetes  Renal/GU Renal InsufficiencyRenal disease  negative genitourinary   Musculoskeletal negative musculoskeletal ROS (+)   Abdominal   Peds negative pediatric ROS (+)  Hematology  (+) anemia ,   Anesthesia Other Findings   Reproductive/Obstetrics negative OB ROS                             Anesthesia Physical Anesthesia Plan  ASA: IV  Anesthesia Plan: MAC   Post-op Pain Management:    Induction: Intravenous  PONV Risk Score and Plan: 2 and Propofol infusion and Treatment may vary due to age or medical condition  Airway Management Planned: Simple Face Mask  Additional Equipment:   Intra-op Plan:   Post-operative Plan:   Informed Consent: I have reviewed the patients History and Physical, chart, labs and discussed the procedure including the risks, benefits and alternatives for the proposed anesthesia with the patient or authorized representative who has indicated his/her understanding and acceptance.     Dental advisory given  Plan Discussed with: CRNA and Surgeon  Anesthesia Plan Comments:         Anesthesia Quick Evaluation

## 2021-02-12 NOTE — Progress Notes (Signed)
The patient was brought down to the endoscopy suite for an EGD.  At that time the patient was not noted to be responsive to voice or commands.  Her pulse ox was reading in the mid 70's.  Using different probes on different sites of her body her pulse ox values ranged from the mid 70's to 100%.  It was difficult to know the true reading.  She did not appear to be cyanotic or in distress.  With further observation the patient did open her eyes weakly to voice when her name was called out.  Blood pressure was stable.  Rapid Response was called to assist in her care.  In light of the current findings the EGD was cancelled.  In fact, it may not be appropriate to perform the procedure with her significant comorbidities and respiratory status.  Plan: 1) Continue PPI therapy. 2) Follow HGB. 3) Continue NPO. 4) ? Need/beneft with an EGD.

## 2021-02-12 NOTE — Progress Notes (Signed)
Pt pulled two PIVs out on this shift, called IV team to reinsert and pt declined, swinging and pulling arms away from RN. Pt was educated why she needs PIV replaced, but still refused. Unable to continue IVF at this time.

## 2021-02-12 NOTE — Progress Notes (Signed)
Upon arrival to endoscopy for EGD procedure, NT and RN were unable to obtain a consistently-reading pulse oximetry on patient. RN and NT tried numerous pulse oximetry devices without successfully obtaining an accurate reading.   Readings varied from 86% to 100%, then sats dropped to 78%.  All other VS were WNL, though patient exhibited drowsy mentation.  RN placed patient on [place] 4L, then bumped the oxygen up to 6L when oxygen level did not increase.  RN consulted with nurse team, including CN.  Patient was placed on simple mask.  RN then increased oxygen level to 10L, but occasional oxygen readings still showed 78%.  While other VS remained WNL, patient continued to exhibit drowsy mentation.  Rapid response team was called.  They were able to get a consistent oxygen reading which showed consistent readings of 96-100% on RA.  Based on the instability of the patient's oxygen level, GI MD made decision to postpone planned EGD procedure. RN assisted patient back to unit.

## 2021-02-12 NOTE — Progress Notes (Signed)
PIV consult: pt not cooperative. Declined IV start despite discussion and teaching. Pt swinging arms and pulling away with bedside RN attempting to redirect/ position arm.
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
## 2021-02-12 NOTE — Progress Notes (Signed)
Called and spoke with pt's daughter about situation with pt. Daughter was notified about the soft wrist restraints applied.

## 2021-02-12 NOTE — Progress Notes (Signed)
Pt is confused and very combative, she is swinging, kicking and hitting staff, refusing to stay in the bed. On call provider notified.

## 2021-02-13 DIAGNOSIS — D649 Anemia, unspecified: Secondary | ICD-10-CM | POA: Diagnosis not present

## 2021-02-13 LAB — COMPREHENSIVE METABOLIC PANEL
ALT: 20 U/L (ref 0–44)
AST: 44 U/L — ABNORMAL HIGH (ref 15–41)
Albumin: 2 g/dL — ABNORMAL LOW (ref 3.5–5.0)
Alkaline Phosphatase: 48 U/L (ref 38–126)
Anion gap: 6 (ref 5–15)
BUN: 18 mg/dL (ref 8–23)
CO2: 22 mmol/L (ref 22–32)
Calcium: 7.6 mg/dL — ABNORMAL LOW (ref 8.9–10.3)
Chloride: 112 mmol/L — ABNORMAL HIGH (ref 98–111)
Creatinine, Ser: 1.42 mg/dL — ABNORMAL HIGH (ref 0.44–1.00)
GFR, Estimated: 36 mL/min — ABNORMAL LOW (ref >60)
Glucose, Bld: 93 mg/dL (ref 70–99)
Potassium: 3.6 mmol/L (ref 3.5–5.1)
Sodium: 140 mmol/L (ref 135–145)
Total Bilirubin: 0.6 mg/dL (ref 0.3–1.2)
Total Protein: 5.4 g/dL — ABNORMAL LOW (ref 6.5–8.1)

## 2021-02-13 LAB — PREPARE RBC (CROSSMATCH)

## 2021-02-13 LAB — CBC
HCT: 20.4 % — ABNORMAL LOW (ref 36.0–46.0)
Hemoglobin: 6.4 g/dL — CL (ref 12.0–15.0)
MCH: 20.8 pg — ABNORMAL LOW (ref 26.0–34.0)
MCHC: 31.4 g/dL (ref 30.0–36.0)
MCV: 66.2 fL — ABNORMAL LOW (ref 80.0–100.0)
Platelets: 133 10*3/uL — ABNORMAL LOW (ref 150–400)
RBC: 3.08 MIL/uL — ABNORMAL LOW (ref 3.87–5.11)
RDW: 24.8 % — ABNORMAL HIGH (ref 11.5–15.5)
WBC: 9.6 10*3/uL (ref 4.0–10.5)
nRBC: 0 % (ref 0.0–0.2)

## 2021-02-13 LAB — GLUCOSE, CAPILLARY
Glucose-Capillary: 79 mg/dL (ref 70–99)
Glucose-Capillary: 90 mg/dL (ref 70–99)
Glucose-Capillary: 121 mg/dL — ABNORMAL HIGH (ref 70–99)
Glucose-Capillary: 85 mg/dL (ref 70–99)
Glucose-Capillary: 92 mg/dL (ref 70–99)

## 2021-02-13 LAB — CULTURE, BLOOD (ROUTINE X 2)

## 2021-02-13 MED ORDER — SODIUM CHLORIDE 0.9% IV SOLUTION: Freq: Once | INTRAVENOUS | Status: AC

## 2021-02-13 MED ORDER — LIP MEDEX EX OINT
1.0000 | TOPICAL_OINTMENT | CUTANEOUS | Status: DC | PRN
Start: 2021-02-13 — End: 2021-02-18
  Filled 2021-02-13: qty 7

## 2021-02-13 NOTE — Progress Notes (Signed)
PROGRESS NOTE    [name]  IOE:[id number] DOB: May 02, 1931 DOA: 02/10/2021 PCP: Charolette Forward, MD    Chief Complaint  Patient presents with  . Failure To Thrive    Brief Narrative:  Chief Complaint: Weakness and pain all over  87 year old female with history of recurrent GI bleed, diabetes mellitus type 2, hypertension, CAD, dementia, history of CVA presented with weakness and generalized pain.  In the ED patient was found to have hemoglobin of 6.7.  Also she had evidence of UTI.  FOBT was positive in the ED.  BUN/creatinine was 30/3.03  GI and palliative care consulted  Subjective:  EGD aborted yesterday due to possible hypoxia Demented elderly, no agitation currently, does follow command She is not oriented to time or placed She denies abdominal pain Currently on room air  Assessment & Plan:   Principal Problem:   Symptomatic anemia Active Problems:   Abdominal pain   Essential hypertension   HLD (hyperlipidemia)   CKD (chronic kidney disease), stage III ([place])   Diabetes mellitus with complication (HCC)   UTI   Leucocytosis  Symptomatic anemia-secondary to GI bleed.  FOBT was positive.  Hemoglobin was 6.7 on admission, she received 2 units PRBC.  Hemoglobin increased to 7.4 yesterday, trending down again this morning, another PRBC given today Case discussed with GI, GI recommended palliative care consult  Addendum: per Dr Collene Mares , daughter wants her mother to be comfortable, treat pain and getting hydration, will need to verify code status with daughter,   AKI on CKD 3B -BUN 38 creatinine 2.03 on presentation -BUN 18 creatinine 1.42 today -She was given antibiotic initially for possible UTI, urine culture multiple species, blood culture no growth, antibiotic was discontinued  Hypertension-blood pressure well controlled, antihypertensive medications are on hold.  Dementia-patient is not on home regimen.  Continue to monitor.  FTT, goals of care  discussion      Unresulted Labs (From admission, onward)          Start     Ordered   02/17/21 0500  Creatinine, serum  (enoxaparin (LOVENOX)    CrCl >/= 30 ml/min)  Weekly,   R     Comments: while on enoxaparin therapy    02/11/21 0327   02/13/21 0929  CBC  Once,   STAT        02/13/21 0929            DVT prophylaxis: Place and maintain sequential compression device Start: 02/11/21 0328   Code Status: per GI Dr Collene Mares daughter wants her mother to be comfortable, will need to verify code status Family Communication: attempted to call daughter, no answers, left voice mail  Disposition:   Status is: Inpatient  Dispo: The patient is from: home              Anticipated d/c is to: TBD              Anticipated d/c date is: TBD                Consultants:   GI   palliative care  Procedures:   Planned EGD aborted on 5/24  Antimicrobials:    Anti-infectives (From admission, onward)   Start     Dose/Rate Route Frequency Ordered Stop   02/11/21 2000  cefTRIAXone (ROCEPHIN) 1 g in sodium chloride 0.9 % 100 mL IVPB  Status:  Discontinued        1 g 200 mL/hr over 30 Minutes Intravenous Every 24 hours  02/11/21 0327 02/12/21 1223   02/10/21 2000  cefTRIAXone (ROCEPHIN) 1 g in sodium chloride 0.9 % 100 mL IVPB        1 g 200 mL/hr over 30 Minutes Intravenous  Once 02/10/21 1949 02/10/21 2219         Objective: Vitals:   02/12/21 1418 02/12/21 1447 02/12/21 2017 02/13/21 0444  BP:  (!) 138/57 (!) 130/49 (!) 122/55  Pulse:  75 64 75  Resp:  20 15 18   Temp:  98.1 F (36.7 C) 98.5 F (36.9 C) 98.1 F (36.7 C)  TempSrc:  Oral Oral Oral  SpO2: (P) 99% (!) 83% (!) 89% 100%    Intake/Output Summary (Last 24 hours) at 02/13/2021 1101 Last data filed at 02/13/2021 0600 Gross per 24 hour  Intake 0 ml  Output --  Net 0 ml   There were no vitals filed for this visit.  Examination:  General exam: demented elderly, frail, attempt to follow commands, currently  calm, NAD Respiratory system: Clear to auscultation. Respiratory effort normal. Cardiovascular system: S1 & S2 heard, RRR. No JVD, no murmur, No pedal edema. Gastrointestinal system: Abdomen is nondistended, soft and nontender. Normal bowel sounds heard. Central nervous system: Alert, demented, moving all extremities Extremities: Generalized weakness, moving all extremities, no edema Skin: No rashes, lesions or ulcers Psychiatry: Demented    Data Reviewed: I have personally reviewed following labs and imaging studies  CBC: Recent Labs  Lab 02/10/21 1834 02/11/21 0826  WBC 13.6* 17.7*  NEUTROABS 11.4*  --   HGB 6.7* 7.4*  HCT 22.4* 24.7*  MCV 64.2* 70.8*  PLT 181 720    Basic Metabolic Panel: Recent Labs  Lab 02/10/21 1834 02/13/21 0917  NA 140 140  K 4.7 3.6  CL 110 112*  CO2 22 22  GLUCOSE 90 93  BUN 38* 18  CREATININE 2.03* 1.42*  CALCIUM 8.0* 7.6*    GFR: CrCl cannot be calculated (Unknown ideal weight.).  Liver Function Tests: Recent Labs  Lab 02/10/21 1834 02/13/21 0917  AST 46* 44*  ALT 12 20  ALKPHOS 60 48  BILITOT 0.9 0.6  PROT 6.6 5.4*  ALBUMIN 2.5* 2.0*    CBG: Recent Labs  Lab 02/12/21 0750 02/12/21 1147 02/12/21 1703 02/12/21 2109 02/13/21 0742  GLUCAP 77 68* 75 102* 79     Recent Results (from the past 240 hour(s))  Blood culture (routine x 2)     Status: None (Preliminary result)   Collection Time: 02/10/21  5:33 PM   Specimen: BLOOD  Result Value Ref Range Status   Specimen Description   Final    BLOOD BLOOD RIGHT FOREARM Performed at [facility], [place] [address] [place], [place] [address]    Special Requests   Final    BOTTLES DRAWN AEROBIC AND ANAEROBIC Blood Culture adequate volume Performed at [facility] [address] [place], [place] [address]    Culture   Final    NO GROWTH 3 DAYS Performed at [facility], [place] [address], [place], [place] [address]    Report  Status PENDING  Incomplete  Urine Culture     Status: Abnormal   Collection Time: 02/10/21  5:34 PM   Specimen: Urine, Random  Result Value Ref Range Status   Specimen Description   Final    URINE, RANDOM Performed at [facility] [address], [place], [place] [address]    Special Requests   Final  NONE Performed at [facility], [place] [address] [place], [place] [address]    Culture MULTIPLE SPECIES PRESENT, SUGGEST RECOLLECTION (A)  Final   Report Status 02/12/2021 FINAL  Final  Blood culture (routine x 2)     Status: None (Preliminary result)   Collection Time: 02/10/21  5:38 PM   Specimen: BLOOD  Result Value Ref Range Status   Specimen Description   Final    BLOOD BLOOD LEFT HAND Performed at [facility] [address] [place], [place] [address]    Special Requests   Final    BOTTLES DRAWN AEROBIC AND ANAEROBIC Blood Culture adequate volume Performed at [facility] [address] [place], [place] [address]    Culture   Final    NO GROWTH 2 DAYS Performed at [facility] [address] [place], [place] [address]    Report Status PENDING  Incomplete  Resp Panel by RT-PCR (Flu A&B, Covid) Nasopharyngeal Swab     Status: None   Collection Time: 02/10/21 11:00 PM   Specimen: Nasopharyngeal Swab; Nasopharyngeal(NP) swabs in vial transport medium  Result Value Ref Range Status   SARS Coronavirus 2 by RT PCR NEGATIVE NEGATIVE Final    Comment: (NOTE) SARS-CoV-2 target nucleic acids are NOT DETECTED.  The SARS-CoV-2 RNA is generally detectable in upper respiratory specimens during the acute phase of infection. The lowest concentration of SARS-CoV-2 viral copies this assay can detect is 138 copies/mL. A negative result does not preclude SARS-Cov-2 infection and should not be used as the sole basis for treatment or other patient management decisions. A negative result may occur with   improper specimen collection/handling, submission of specimen other than nasopharyngeal swab, presence of viral mutation(s) within the areas targeted by this assay, and inadequate number of viral copies(<138 copies/mL). A negative result must be combined with clinical observations, patient history, and epidemiological information. The expected result is Negative.  Fact Sheet for Patients:  EntrepreneurPulse.com.au  Fact Sheet for Healthcare Providers:  IncredibleEmployment.be  This test is no t yet approved or cleared by the [place] FDA and  has been authorized for detection and/or diagnosis of SARS-CoV-2 by FDA under an Emergency Use Authorization (EUA). This EUA will remain  in effect (meaning this test can be used) for the duration of the COVID-19 declaration under Section 564(b)(1) of the Act, 21 U.S.C.section 360bbb-3(b)(1), unless the authorization is terminated  or revoked sooner.       Influenza A by PCR NEGATIVE NEGATIVE Final   Influenza B by PCR NEGATIVE NEGATIVE Final    Comment: (NOTE) The Xpert Xpress SARS-CoV-2/FLU/RSV plus assay is intended as an aid in the diagnosis of influenza from Nasopharyngeal swab specimens and should not be used as a sole basis for treatment. Nasal washings and aspirates are unacceptable for Xpert Xpress SARS-CoV-2/FLU/RSV testing.  Fact Sheet for Patients: EntrepreneurPulse.com.au  Fact Sheet for Healthcare Providers: IncredibleEmployment.be  This test is not yet approved or cleared by the [place] FDA and has been authorized for detection and/or diagnosis of SARS-CoV-2 by FDA under an Emergency Use Authorization (EUA). This EUA will remain in effect (meaning this test can be used) for the duration of the COVID-19 declaration under Section 564(b)(1) of the Act, 21 U.S.C. section 360bbb-3(b)(1), unless the authorization is terminated  or revoked.  Performed at [facility], [place] [address] [place], [place] [address]          Radiology Studies: No results found.  Scheduled Meds: . insulin aspart  0-15 Units Subcutaneous TID WC  . insulin aspart  0-5 Units Subcutaneous QHS  . pantoprazole (PROTONIX) IV  40 mg Intravenous Q12H   Continuous Infusions: . sodium chloride    . dextrose 5 % and 0.45% NaCl 100 mL/hr at 02/13/21 0439     LOS: 3 days   Time spent: 25 mins Greater than 50% of this time was spent in counseling, explanation of diagnosis, planning of further management, and coordination of care.   Voice Recognition Viviann Spare dictation system was used to create this note, attempts have been made to correct errors. Please contact the author with questions and/or clarifications.   Florencia Reasons, MD PhD FACP [facility]  Available via Epic secure chat 7am-7pm for nonurgent issues Please page for urgent issues To page the attending provider between 7A-7P or the covering provider during after hours 7P-7A, please log into the web site [email or web address] and access using universal [place] password for that web site. If you do not have the password, please call the hospital operator.    02/13/2021, 11:01 AM

## 2021-02-13 NOTE — Plan of Care (Signed)
  Problem: Skin Integrity: Goal: Risk for impaired skin integrity will decrease Outcome: Progressing   

## 2021-02-13 NOTE — Progress Notes (Signed)
Patient ID: [name], female   DOB: November 11, 1930, 87 y.o.   MRN: [id number] I spoke to the patient's daughter Yaminah Clayborn today discussed the patient's deteriorating status with her and she agreed that comfort care would be the best approach for her mother. I discussed this with Dr. Erlinda Hong.  Please call if further assistance is needed from a GI standpoint

## 2021-02-13 NOTE — Plan of Care (Signed)
  Problem: Clinical Measurements: Goal: Diagnostic test results will improve Outcome: Progressing   Problem: Clinical Measurements: Goal: Respiratory complications will improve Outcome: Progressing   Problem: Safety: Goal: Ability to remain free from injury will improve Outcome: Progressing

## 2021-02-14 DIAGNOSIS — D649 Anemia, unspecified: Secondary | ICD-10-CM | POA: Diagnosis not present

## 2021-02-14 DIAGNOSIS — Z7189 Other specified counseling: Secondary | ICD-10-CM | POA: Diagnosis not present

## 2021-02-14 DIAGNOSIS — R531 Weakness: Secondary | ICD-10-CM | POA: Diagnosis not present

## 2021-02-14 DIAGNOSIS — Z515 Encounter for palliative care: Secondary | ICD-10-CM | POA: Diagnosis not present

## 2021-02-14 LAB — GLUCOSE, CAPILLARY
Glucose-Capillary: 83 mg/dL (ref 70–99)
Glucose-Capillary: 75 mg/dL (ref 70–99)
Glucose-Capillary: 159 mg/dL — ABNORMAL HIGH (ref 70–99)
Glucose-Capillary: 104 mg/dL — ABNORMAL HIGH (ref 70–99)
Glucose-Capillary: 100 mg/dL — ABNORMAL HIGH (ref 70–99)

## 2021-02-14 LAB — TYPE AND SCREEN
ABO/RH(D): O POS
Antibody Screen: NEGATIVE
Unit division: 0
Unit division: 0

## 2021-02-14 LAB — BPAM RBC
Blood Product Expiration Date: 202206222359
Blood Product Expiration Date: 202206242359
ISSUE DATE / TIME: 202205222341
ISSUE DATE / TIME: 202205251138
Unit Type and Rh: 5100
Unit Type and Rh: 5100

## 2021-02-14 LAB — CULTURE, BLOOD (ROUTINE X 2)

## 2021-02-14 NOTE — Consult Note (Signed)
Consultation Note Date: 02/14/2021   [name]  DOB: August 23, 1931  MRN: [id number]  Age / Sex: 87 y.o., female  PCP: Charolette Forward, MD Referring Physician: Florencia Reasons, MD  Reason for Consultation: Establishing goals of care  HPI/Patient Profile: 87 y.o. female  admitted on 02/10/2021   Clinical Assessment and Goals of Care: 87 year old lady who lives at home with 2 of her sons.  She has a past medical history significant for dementia, history of stroke, diabetes hypertension and coronary artery disease.  She has a history of recurrent GI bleeds in the past.  Chart reviewed.  She has seen gastroenterology several times in the past.  She has had rectal bleeding she has had colonic diverticulosis as well as polyps in the past.  Patient admitted to hospital medicine service with weakness generalized pain hemoglobin of 6.7 evidence of urinary tract infection and possible recurrent GI bleed.  Upper endoscopy was attempted however was not able to be done because of possible hypoxia.  A palliative consultation has thus been requested after goals of care discussions were held between gastroenterology and the patient's daughter with regards to feasibility of EGD study and for additional broad goals of care discussions palliative has been consulted.  Patient is awake alert resting in bed.  She has baseline dementia.  She does not know why she is in the hospital.  She does admit to episodic abdominal discomfort.  Initially she felt nauseous but denies nausea currently.  Does not feel hungry.  Has low blood sugars and is being given juice and possibly diet is going to be advanced as well.  After discussions with gastroenterology, family has decided not to pursue with EGD and to focus on medical management.  Remains on PPI.  Palliative consultation continues.  I placed a call and was able to reach daughter [name]  at [phone].  I introduced myself and palliative care as follows: Palliative medicine is specialized medical care for people living with serious illness. It focuses on providing relief from the symptoms and stress of a serious illness. The goal is to improve quality of life for both the patient and the family.  Goals of care: Broad aims of medical therapy in relation to the patient's values and preferences. Our aim is to provide medical care aimed at enabling patients to achieve the goals that matter most to them, given the circumstances of their particular medical situation and their constraints.   [name] is thankful for the information she has been provided regarding her mother's care.  She is able to recall her discussions with gastroenterology.  See additional discussion/summary of recommendations as listed below.  HCPOA Daughter [name]  Discussions/SUMMARY OF RECOMMENDATIONS   Call placed and goals of care discussions undertaken with daughter [name] at [phone].  [name] stated that she is the patient's healthcare power of attorney agent.  [name] stated that after discussions with gastroenterology, she has had a chance to discuss with all of her siblings and general consensus from all of the patient's Children  is for her to be kept as comfortable as possible.  We discussed about possible etiology of her GI bleed, it could be diverticular bleed, patient has history of colonic diverticulosis in the past, her ongoing low hemoglobin, her low oxygen saturations off and on, her current condition in detail and that overall the patient is at high risk for ongoing decline and decompensation.  Goals wishes and values hence attempted to be explored.  CODE STATUS discussions undertaken. Plan: DNR/DNI Continue current mode of care with proton pump inhibitor as well as blood transfusions for a time trial of the next 2 to 3 days.  Patient has 9 children and they are all trying to come visit with her in  the hospital. After time trial of current interventions, it would be most prudent to pursue residential hospice.  Briefly discussed with [name] about the type of care that is given inside a residential hospice-full focus on comfort measures and on no blood transfusions, aggressive symptom management at end-of-life.  She will discuss further with her siblings. Thank you for the consult. Code Status/Advance Care Planning:  DNR    Symptom Management:      Palliative Prophylaxis:   Delirium Protocol  Additional Recommendations (Limitations, Scope, Preferences):  No Surgical Procedures  Psycho-social/Spiritual:   Desire for further Chaplaincy support:yes  Additional Recommendations: Caregiving  Support/Resources  Prognosis:   Guarded, could be as short as 2-3 weeks.   Discharge Planning: To Be Determined      Primary Diagnoses: Present on Admission: . Symptomatic anemia . Abdominal pain . CKD (chronic kidney disease), stage III ([place]) . Diabetes mellitus with complication ([place]) . Essential hypertension . HLD (hyperlipidemia) . UTI . Leucocytosis   I have reviewed the medical record, interviewed the patient and family, and examined the patient. The following aspects are pertinent.  Past Medical History:  Diagnosis Date  . Coronary artery disease   . Diabetes mellitus without complication ([place])    TYPE 2   . GI bleeding 09/2015  . Hyperlipidemia   . Hypertension   . Stroke [facility])    Social History   Socioeconomic History  . Marital status: Widowed    Spouse name: Not on file  . Number of children: 10  . Years of education: Not on file  . Highest education level: High school graduate  Occupational History  . Occupation: Retired  Tobacco Use  . Smoking status: Former Research scientist (life sciences)  . Smokeless tobacco: Never Used  . Tobacco comment: quit smoking many years ago  Vaping Use  . Vaping Use: Never used  Substance and Sexual Activity  . Alcohol use: No  . Drug use:  No  . Sexual activity: Not on file  Other Topics Concern  . Not on file  Social History Narrative  . Not on file   Social Determinants of Health   Financial Resource Strain: Not on file  Food Insecurity: Not on file  Transportation Needs: Not on file  Physical Activity: Not on file  Stress: Not on file  Social Connections: Not on file   Family History  Family history unknown: Yes   Scheduled Meds: . insulin aspart  0-15 Units Subcutaneous TID WC  . insulin aspart  0-5 Units Subcutaneous QHS  . pantoprazole (PROTONIX) IV  40 mg Intravenous Q12H   Continuous Infusions: . sodium chloride    . dextrose 5 % and 0.45% NaCl 100 mL/hr at 02/13/21 2025   PRN Meds:.acetaminophen **OR** acetaminophen, lip balm, ondansetron **OR** ondansetron (ZOFRAN) IV Medications  Prior to Admission:  Prior to Admission medications   Medication Sig Start Date End Date Taking? Authorizing Provider  amLODipine (NORVASC) 2.5 MG tablet Take 2.5 mg by mouth daily.    Yes [provider]  aspirin EC 81 MG tablet Take 81 mg by mouth daily. Swallow whole.   Yes [provider]  atorvastatin (LIPITOR) 20 MG tablet Take 20 mg by mouth daily. 01/03/21  Yes [provider]  isosorbide mononitrate (IMDUR) 30 MG 24 hr tablet Take 30 mg by mouth daily.   Yes [provider]  losartan (COZAAR) 25 MG tablet Take 25 mg by mouth daily. 01/03/21  Yes [provider]  metoprolol succinate (TOPROL-XL) 50 MG 24 hr tablet Take 50 mg by mouth daily. 12/29/16  Yes [provider]  nitroGLYCERIN (NITROSTAT) 0.4 MG SL tablet Place 1 tablet (0.4 mg total) under the tongue every 5 (five) minutes as needed for chest pain. 12/09/12  Yes Charolette Forward, MD  ferrous sulfate 325 (65 FE) MG tablet Take 1 tablet (325 mg total) by mouth 3 (three) times daily with meals. Patient not taking: Reported on 02/11/2021 06/17/18   Charolette Forward, MD  polyethylene glycol (MIRALAX / GLYCOLAX) packet  Take 17 g by mouth daily. Patient not taking: Reported on 02/11/2021 02/20/17   Eugenie Filler, MD   No Known Allergies Review of Systems Patient admits to mild generalized abdominal discomfort off and on. Physical Exam Frail elderly lady with a history of dementia resting in bed in She is able to state her name and follow commands however lacks insight into her overall condition and reason for hospitalization. Regular work of breathing S1-S2 Moves all extremities spontaneously Has generalized weakness Has baseline dementia Abdomen has mild generalized tenderness and mild discomfort generalized.   Vital Signs: BP (!) 148/57 (BP Location: Right Arm)   Pulse 76   Temp 98.6 F (37 C) (Oral)   Resp 18   SpO2 100%  Pain Scale: PAINAD POSS *See Group Information*: 1-Acceptable,Awake and alert Pain Score: Asleep   SpO2: SpO2: 100 % O2 Device:SpO2: 100 % O2 Flow Rate: .O2 Flow Rate (L/min): 2 L/min  IO: Intake/output summary:   Intake/Output Summary (Last 24 hours) at 02/14/2021 0934 Last data filed at 02/14/2021 0600 Gross per 24 hour  Intake 823.75 ml  Output --  Net 823.75 ml    LBM: Last BM Date: 02/12/21 Baseline Weight:   Most recent weight:       Palliative Assessment/Data:   PPS 30%  Time In:  8.30 Time Out:  9.30 Time Total:  60 min.  Greater than 50%  of this time was spent counseling and coordinating care related to the above assessment and plan.  Signed by: Loistine Chance, MD   Please contact Palliative Medicine Team phone at [phone] for questions and concerns.  For individual provider: See Shea Evans

## 2021-02-14 NOTE — Progress Notes (Signed)
PROGRESS NOTE    [name]  DEY:[id number] DOB: Jul 16, 1931 DOA: 02/10/2021 PCP: Charolette Forward, MD    Chief Complaint  Patient presents with  . Failure To Thrive    Brief Narrative:  Chief Complaint: Weakness and pain all over  87 year old female with history of recurrent GI bleed, diabetes mellitus type 2, hypertension, CAD, dementia, history of CVA presented with weakness and generalized pain.  In the ED patient was found to have hemoglobin of 6.7.  Also she had evidence of UTI.  FOBT was positive in the ED.  BUN/creatinine was 30/3.03  GI and palliative care consulted  Subjective:  After discussion with GI, family decided to forgo EGD, focus on supportive care/comfort   Demented elderly, no agitation currently, does follow command She is not oriented to time , she knows she is in the hospital , she said" my daughter told me I am very sick " She denies abdominal pain Currently on room air  Assessment & Plan:   Principal Problem:   Symptomatic anemia Active Problems:   Abdominal pain   Essential hypertension   HLD (hyperlipidemia)   CKD (chronic kidney disease), stage III (HCC)   Diabetes mellitus with complication ([place])   UTI   Leucocytosis  Symptomatic anemia-secondary to GI bleed.  FOBT was positive.  Hemoglobin was 6.7 on admission, she received 2 units PRBC.  Hemoglobin increased to 7.4 yesterday, trending down again this morning, another PRBC given on 5/25 Case discussed with GI, GI recommended palliative care consult Palliative care input appreciated, family decided supportive transfusion as needed for now, likely transition to residential hospice in 2-3 days   AKI on CKD 3B -BUN 38 creatinine 2.03 on presentation -BUN 18 creatinine 1.42 on 5/25 -She was given antibiotic initially for possible UTI, urine culture multiple species, blood culture no growth, antibiotic was discontinued -continue hydration for another 24hrs, repeat bmp in  am  Hypoglycemia Continue d5, regular diet monitor  Hypertension-blood pressure well controlled, antihypertensive medications are on hold.  Dementia-patient is not on home regimen.  Continue to monitor.  FTT, goals of care discussion, Palliative care input appreciated, family decided supportive transfusion as needed for now, likely transition to residential hospice in 2-3 days      Unresulted Labs (From admission, onward)          Start     Ordered   02/17/21 0500  Creatinine, serum  (enoxaparin (LOVENOX)    CrCl >/= 30 ml/min)  Weekly,   R     Comments: while on enoxaparin therapy    02/11/21 0327            DVT prophylaxis: Place and maintain sequential compression device Start: 02/11/21 0328   Code Status: DNR/DNI Family Communication: no family at bedside   Disposition:   Status is: Inpatient  Dispo: The patient is from: home              Anticipated d/c is to: residential hospice              Anticipated d/c date is: family wanted trial of prbc transfusion for the next 2-3 days, then plan for residential hospice                Consultants:   GI   palliative care  Procedures:   Planned EGD aborted on 5/24  Antimicrobials:    Anti-infectives (From admission, onward)   Start     Dose/Rate Route Frequency Ordered Stop   02/11/21 2000  cefTRIAXone (ROCEPHIN) 1 g in sodium chloride 0.9 % 100 mL IVPB  Status:  Discontinued        1 g 200 mL/hr over 30 Minutes Intravenous Every 24 hours 02/11/21 0327 02/12/21 1223   02/10/21 2000  cefTRIAXone (ROCEPHIN) 1 g in sodium chloride 0.9 % 100 mL IVPB        1 g 200 mL/hr over 30 Minutes Intravenous  Once 02/10/21 1949 02/10/21 2219         Objective: Vitals:   02/13/21 1212 02/13/21 1459 02/13/21 2054 02/14/21 0457  BP: (!) 132/59 (!) 131/50 (!) 127/54 (!) 148/57  Pulse: 74 77 70 76  Resp: 16  18 18   Temp: 98.3 F (36.8 C) 98 F (36.7 C) 99 F (37.2 C) 98.6 F (37 C)  TempSrc: Oral Oral Oral  Oral  SpO2: 100% 100% 100% 100%    Intake/Output Summary (Last 24 hours) at 02/14/2021 1157 Last data filed at 02/14/2021 0600 Gross per 24 hour  Intake 508.75 ml  Output --  Net 508.75 ml   There were no vitals filed for this visit.  Examination:  General exam: demented elderly, frail, attempt to follow commands, currently calm, NAD Respiratory system: Clear to auscultation. Respiratory effort normal. Cardiovascular system: S1 & S2 heard, RRR. No JVD, no murmur, No pedal edema. Gastrointestinal system: Abdomen is nondistended, soft and nontender. Normal bowel sounds heard. Central nervous system: Alert, demented, moving all extremities Extremities: Generalized weakness, moving all extremities, no edema Skin: No rashes, lesions or ulcers Psychiatry: Demented    Data Reviewed: I have personally reviewed following labs and imaging studies  CBC: Recent Labs  Lab 02/10/21 1834 02/11/21 0826 02/13/21 1037  WBC 13.6* 17.7* 9.6  NEUTROABS 11.4*  --   --   HGB 6.7* 7.4* 6.4*  HCT 22.4* 24.7* 20.4*  MCV 64.2* 70.8* 66.2*  PLT 181 167 133*    Basic Metabolic Panel: Recent Labs  Lab 02/10/21 1834 02/13/21 0917  NA 140 140  K 4.7 3.6  CL 110 112*  CO2 22 22  GLUCOSE 90 93  BUN 38* 18  CREATININE 2.03* 1.42*  CALCIUM 8.0* 7.6*    GFR: CrCl cannot be calculated (Unknown ideal weight.).  Liver Function Tests: Recent Labs  Lab 02/10/21 1834 02/13/21 0917  AST 46* 44*  ALT 12 20  ALKPHOS 60 48  BILITOT 0.9 0.6  PROT 6.6 5.4*  ALBUMIN 2.5* 2.0*    CBG: Recent Labs  Lab 02/13/21 2056 02/13/21 2222 02/14/21 0749 02/14/21 0859 02/14/21 1125  GLUCAP 85 92 83 75 159*     Recent Results (from the past 240 hour(s))  Blood culture (routine x 2)     Status: None (Preliminary result)   Collection Time: 02/10/21  5:33 PM   Specimen: BLOOD  Result Value Ref Range Status   Specimen Description   Final    BLOOD BLOOD RIGHT FOREARM Performed at [facility], [place] [address], [place], [place] [address]    Special Requests   Final    BOTTLES DRAWN AEROBIC AND ANAEROBIC Blood Culture adequate volume Performed at [place] [address] [place], [place] [address]    Culture   Final    NO GROWTH 4 DAYS Performed at [facility], [place] [address] [place], [place] [address]    Report Status PENDING  Incomplete  Urine Culture     Status: Abnormal   Collection Time: 02/10/21  5:34 PM  Specimen: Urine, Random  Result Value Ref Range Status   Specimen Description   Final    URINE, RANDOM Performed at [place] [address], [place], [place] [address]    Special Requests   Final    NONE Performed at [facility], [place] [address] [place], [place] [address]    Culture MULTIPLE SPECIES PRESENT, SUGGEST RECOLLECTION (A)  Final   Report Status 02/12/2021 FINAL  Final  Blood culture (routine x 2)     Status: None (Preliminary result)   Collection Time: 02/10/21  5:38 PM   Specimen: BLOOD  Result Value Ref Range Status   Specimen Description   Final    BLOOD BLOOD LEFT HAND Performed at [place] [address], [place], [place] [address]    Special Requests   Final    BOTTLES DRAWN AEROBIC AND ANAEROBIC Blood Culture adequate volume Performed at [place] [address] [place], [place] [address]    Culture   Final    NO GROWTH 3 DAYS Performed at [facility], [place] [address], [place],  [address]    Report Status PENDING  Incomplete  Resp Panel by RT-PCR (Flu A&B, Covid) Nasopharyngeal Swab     Status: None   Collection Time: 02/10/21 11:00 PM   Specimen: Nasopharyngeal Swab; Nasopharyngeal(NP) swabs in vial transport medium  Result Value Ref Range Status   SARS Coronavirus 2 by RT PCR NEGATIVE NEGATIVE Final    Comment: (NOTE) SARS-CoV-2 target nucleic acids are NOT DETECTED.  The  SARS-CoV-2 RNA is generally detectable in upper respiratory specimens during the acute phase of infection. The lowest concentration of SARS-CoV-2 viral copies this assay can detect is 138 copies/mL. A negative result does not preclude SARS-Cov-2 infection and should not be used as the sole basis for treatment or other patient management decisions. A negative result may occur with  improper specimen collection/handling, submission of specimen other than nasopharyngeal swab, presence of viral mutation(s) within the areas targeted by this assay, and inadequate number of viral copies(<138 copies/mL). A negative result must be combined with clinical observations, patient history, and epidemiological information. The expected result is Negative.  Fact Sheet for Patients:  EntrepreneurPulse.com.au  Fact Sheet for Healthcare Providers:  IncredibleEmployment.be  This test is no t yet approved or cleared by the [place] FDA and  has been authorized for detection and/or diagnosis of SARS-CoV-2 by FDA under an Emergency Use Authorization (EUA). This EUA will remain  in effect (meaning this test can be used) for the duration of the COVID-19 declaration under Section 564(b)(1) of the Act, 21 U.S.C.section 360bbb-3(b)(1), unless the authorization is terminated  or revoked sooner.       Influenza A by PCR NEGATIVE NEGATIVE Final   Influenza B by PCR NEGATIVE NEGATIVE Final    Comment: (NOTE) The Xpert Xpress SARS-CoV-2/FLU/RSV plus assay is intended as an aid in the diagnosis of influenza from Nasopharyngeal swab specimens and should not be used as a sole basis for treatment. Nasal washings and aspirates are unacceptable for Xpert Xpress SARS-CoV-2/FLU/RSV testing.  Fact Sheet for Patients: EntrepreneurPulse.com.au  Fact Sheet for Healthcare Providers: IncredibleEmployment.be  This test is not yet approved or  cleared by the [place] FDA and has been authorized for detection and/or diagnosis of SARS-CoV-2 by FDA under an Emergency Use Authorization (EUA). This EUA will remain in effect (meaning this test can be used) for the duration of the COVID-19 declaration  under Section 564(b)(1) of the Act, 21 U.S.C. section 360bbb-3(b)(1), unless the authorization is terminated or revoked.  Performed at [facility], [place] [address] [place], [place] [address]          Radiology Studies: No results found.      Scheduled Meds: . insulin aspart  0-15 Units Subcutaneous TID WC  . insulin aspart  0-5 Units Subcutaneous QHS  . pantoprazole (PROTONIX) IV  40 mg Intravenous Q12H   Continuous Infusions: . sodium chloride    . dextrose 5 % and 0.45% NaCl 100 mL/hr at 02/14/21 0948     LOS: 4 days   Time spent: 25 mins Greater than 50% of this time was spent in counseling, explanation of diagnosis, planning of further management, and coordination of care.   Voice Recognition Viviann Spare dictation system was used to create this note, attempts have been made to correct errors. Please contact the author with questions and/or clarifications.   Florencia Reasons, MD PhD FACP [facility]  Available via Epic secure chat 7am-7pm for nonurgent issues Please page for urgent issues To page the attending provider between 7A-7P or the covering provider during after hours 7P-7A, please log into the web site [email or web address] and access using universal [place] password for that web site. If you do not have the password, please call the hospital operator.    02/14/2021, 11:57 AM

## 2021-02-14 NOTE — Care Management Important Message (Signed)
Important Message  Patient Details IM Letter given to the Patient. Name: [name] MRN: [id number] Date of Birth: 1931-08-13   Medicare Important Message Given:  Yes     Kerin Salen 02/14/2021, 11:26 AM

## 2021-02-15 DIAGNOSIS — D649 Anemia, unspecified: Secondary | ICD-10-CM | POA: Diagnosis not present

## 2021-02-15 LAB — BASIC METABOLIC PANEL
Anion gap: 6 (ref 5–15)
BUN: 9 mg/dL (ref 8–23)
CO2: 22 mmol/L (ref 22–32)
Calcium: 7.6 mg/dL — ABNORMAL LOW (ref 8.9–10.3)
Chloride: 110 mmol/L (ref 98–111)
Creatinine, Ser: 1.22 mg/dL — ABNORMAL HIGH (ref 0.44–1.00)
GFR, Estimated: 43 mL/min — ABNORMAL LOW (ref >60)
Glucose, Bld: 87 mg/dL (ref 70–99)
Potassium: 3 mmol/L — ABNORMAL LOW (ref 3.5–5.1)
Sodium: 138 mmol/L (ref 135–145)

## 2021-02-15 LAB — CBC
HCT: 26.3 % — ABNORMAL LOW (ref 36.0–46.0)
Hemoglobin: 8 g/dL — ABNORMAL LOW (ref 12.0–15.0)
MCH: 22.7 pg — ABNORMAL LOW (ref 26.0–34.0)
MCHC: 30.4 g/dL (ref 30.0–36.0)
MCV: 74.7 fL — ABNORMAL LOW (ref 80.0–100.0)
Platelets: 129 10*3/uL — ABNORMAL LOW (ref 150–400)
RBC: 3.52 MIL/uL — ABNORMAL LOW (ref 3.87–5.11)
RDW: 26.4 % — ABNORMAL HIGH (ref 11.5–15.5)
WBC: 5.7 10*3/uL (ref 4.0–10.5)
nRBC: 0 % (ref 0.0–0.2)

## 2021-02-15 LAB — GLUCOSE, CAPILLARY
Glucose-Capillary: 90 mg/dL (ref 70–99)
Glucose-Capillary: 83 mg/dL (ref 70–99)
Glucose-Capillary: 107 mg/dL — ABNORMAL HIGH (ref 70–99)

## 2021-02-15 LAB — CULTURE, BLOOD (ROUTINE X 2)
Culture: NO GROWTH
Special Requests: ADEQUATE

## 2021-02-15 MED ORDER — METOPROLOL TARTRATE 12.5 MG HALF TABLET
12.5000 mg | ORAL_TABLET | Freq: Two times a day (BID) | ORAL | Status: DC
  Administered 2021-02-15 – 2021-02-18 (×6): 12.5 mg via ORAL
  Filled 2021-02-15 (×7): qty 1

## 2021-02-15 MED ORDER — POTASSIUM CHLORIDE CRYS ER 10 MEQ PO TBCR
40.0000 meq | EXTENDED_RELEASE_TABLET | Freq: Once | ORAL | Status: AC
  Administered 2021-02-15: 40 meq via ORAL
  Filled 2021-02-15: qty 4

## 2021-02-15 NOTE — Progress Notes (Signed)
AM Hemoglobin showed increase from 6.4 to 8.0. Lab called RN to see if second H and H should be drawn to confirm. MD XU made aware and advised no need for redraw of H and H this AM.

## 2021-02-15 NOTE — Progress Notes (Signed)
PROGRESS NOTE    [name]  JHE:[id number] DOB: [birth year] DOA: 02/10/2021 PCP: Charolette Forward, MD    Chief Complaint  Patient presents with  . Failure To Thrive    Brief Narrative:  Chief Complaint: Weakness and pain all over  87 year old female with history of recurrent GI bleed, diabetes mellitus type 2, hypertension, CAD, dementia, history of CVA presented with weakness and generalized pain.  In the ED patient was found to have hemoglobin of 6.7.  Also she had evidence of UTI.  FOBT was positive in the ED.  BUN/creatinine was 30/3.03  GI and palliative care consulted  Subjective:   she does not eat much, she is on d5 infusion, blood glucose borderline low Demented elderly, no agitation currently, does follow command She is not oriented to time , she knows she is in the hospital  She denies abdominal pain Currently on room air Family  At bedside   Assessment & Plan:   Principal Problem:   Symptomatic anemia Active Problems:   Abdominal pain   Essential hypertension   HLD (hyperlipidemia)   CKD (chronic kidney disease), stage III (HCC)   Diabetes mellitus with complication (HCC)   UTI   Leucocytosis  Symptomatic anemia-secondary to GI bleed.  FOBT was positive.  Hemoglobin was 6.7 on admission, she received 2 units PRBC.  Hemoglobin increased to 7.4 yesterday, trending down again this morning, another PRBC given on 5/25 Case discussed with GI, GI recommended palliative care consult Palliative care input appreciated, family decided supportive transfusion as needed for now, likely transition to hospice    AKI on CKD 3B -BUN 38 creatinine 2.03 on presentation -BUN 18 creatinine 1.42 on 5/25 -She was given antibiotic initially for possible UTI, urine culture multiple species, blood culture no growth, antibiotic was discontinued -continue hydration as oral intake is very poor  Hypoglycemia Continue d5, regular diet monitor  Hypertension- antihypertensive  medications held since admission, bp start to trend up, resume low dose lopressor  Dementia-patient is not on meds PTA,   FTT, goals of care discussion, Palliative care input appreciated, family decided supportive transfusion as needed for now, likely transition to  hospice in 2-3 days      Unresulted Labs (From admission, onward)          Start     Ordered   02/17/21 0500  Creatinine, serum  (enoxaparin (LOVENOX)    CrCl >/= 30 ml/min)  Weekly,   R     Comments: while on enoxaparin therapy    02/11/21 0327            DVT prophylaxis: Place and maintain sequential compression device Start: 02/11/21 0328   Code Status: DNR/DNI Family Communication: daughter  Disposition:   Status is: Inpatient  Dispo: The patient is from: home              Anticipated d/c is to: residential hospice vs home hospice               Anticipated d/c date is: family wanted trial of prbc transfusion for the next 2-3 days, then plan for  hospice                Consultants:   GI   palliative care  Procedures:   Planned EGD aborted on 5/24  Antimicrobials:    Anti-infectives (From admission, onward)   Start     Dose/Rate Route Frequency Ordered Stop   02/11/21 2000  cefTRIAXone (ROCEPHIN) 1 g in sodium chloride  0.9 % 100 mL IVPB  Status:  Discontinued        1 g 200 mL/hr over 30 Minutes Intravenous Every 24 hours 02/11/21 0327 02/12/21 1223   02/10/21 2000  cefTRIAXone (ROCEPHIN) 1 g in sodium chloride 0.9 % 100 mL IVPB        1 g 200 mL/hr over 30 Minutes Intravenous  Once 02/10/21 1949 02/10/21 2219         Objective: Vitals:   02/14/21 0457 02/14/21 1337 02/14/21 1941 02/15/21 0809  BP: (!) 148/57 (!) 150/61 (!) 158/75 (!) 152/77  Pulse: 76 71 92 76  Resp: 18 18 18    Temp: 98.6 F (37 C) 98.1 F (36.7 C) 98.5 F (36.9 C) 98.4 F (36.9 C)  TempSrc: Oral Oral Oral Oral  SpO2: 100% 100% 100% 100%   No intake or output data in the 24 hours ending 02/15/21  1616 There were no vitals filed for this visit.  Examination:  General exam: demented elderly, frail, attempt to follow commands, currently calm, NAD Respiratory system: Clear to auscultation. Respiratory effort normal. Cardiovascular system: S1 & S2 heard, RRR. No JVD, no murmur, No pedal edema. Gastrointestinal system: Abdomen is nondistended, soft and nontender. Normal bowel sounds heard. Central nervous system: Alert, demented, moving all extremities Extremities: Generalized weakness, moving all extremities, no edema Skin: No rashes, lesions or ulcers Psychiatry: Demented    Data Reviewed: I have personally reviewed following labs and imaging studies  CBC: Recent Labs  Lab 02/10/21 1834 02/11/21 0826 02/13/21 1037 02/15/21 0553  WBC 13.6* 17.7* 9.6 5.7  NEUTROABS 11.4*  --   --   --   HGB 6.7* 7.4* 6.4* 8.0*  HCT 22.4* 24.7* 20.4* 26.3*  MCV 64.2* 70.8* 66.2* 74.7*  PLT 181 167 133* 129*    Basic Metabolic Panel: Recent Labs  Lab 02/10/21 1834 02/13/21 0917 02/15/21 0553  NA 140 140 138  K 4.7 3.6 3.0*  CL 110 112* 110  CO2 22 22 22   GLUCOSE 90 93 87  BUN 38* 18 9  CREATININE 2.03* 1.42* 1.22*  CALCIUM 8.0* 7.6* 7.6*    GFR: CrCl cannot be calculated (Unknown ideal weight.).  Liver Function Tests: Recent Labs  Lab 02/10/21 1834 02/13/21 0917  AST 46* 44*  ALT 12 20  ALKPHOS 60 48  BILITOT 0.9 0.6  PROT 6.6 5.4*  ALBUMIN 2.5* 2.0*    CBG: Recent Labs  Lab 02/14/21 1125 02/14/21 1746 02/14/21 2102 02/15/21 0804 02/15/21 1149  GLUCAP 159* 104* 100* 90 83     Recent Results (from the past 240 hour(s))  Blood culture (routine x 2)     Status: None   Collection Time: 02/10/21  5:33 PM   Specimen: BLOOD  Result Value Ref Range Status   Specimen Description   Final    BLOOD BLOOD RIGHT FOREARM Performed at [facility], [place] [address], [place], [place] [address]    Special Requests   Final    BOTTLES DRAWN AEROBIC  AND ANAEROBIC Blood Culture adequate volume Performed at [place] [address] [place], [place] [address]    Culture   Final    NO GROWTH 5 DAYS Performed at [facility], [place] [address], [place], [place] [address]    Report Status 02/15/2021 FINAL  Final  Urine Culture     Status: Abnormal   Collection Time: 02/10/21  5:34 PM   Specimen: Urine, Random  Result Value Ref Range  Status   Specimen Description   Final    URINE, RANDOM Performed at [facility] [address] [place], [place] [address]    Special Requests   Final    NONE Performed at [facility], [place] [address], [place], [place] [address]    Culture MULTIPLE SPECIES PRESENT, SUGGEST RECOLLECTION (A)  Final   Report Status 02/12/2021 FINAL  Final  Blood culture (routine x 2)     Status: None (Preliminary result)   Collection Time: 02/10/21  5:38 PM   Specimen: BLOOD  Result Value Ref Range Status   Specimen Description   Final    BLOOD BLOOD LEFT HAND Performed at [place] [address] [place], [place] [address]    Special Requests   Final    BOTTLES DRAWN AEROBIC AND ANAEROBIC Blood Culture adequate volume Performed at [place] [address] [place], [place] [address]    Culture   Final    NO GROWTH 4 DAYS Performed at [facility], [place] [address], [place], [place] [address]    Report Status PENDING  Incomplete  Resp Panel by RT-PCR (Flu A&B, Covid) Nasopharyngeal Swab     Status: None   Collection Time: 02/10/21 11:00 PM   Specimen: Nasopharyngeal Swab; Nasopharyngeal(NP) swabs in vial transport medium  Result Value Ref Range Status   SARS Coronavirus 2 by RT PCR NEGATIVE NEGATIVE Final    Comment: (NOTE) SARS-CoV-2 target nucleic acids are NOT DETECTED.  The SARS-CoV-2 RNA is generally detectable in upper respiratory specimens during the acute phase of infection. The  lowest concentration of SARS-CoV-2 viral copies this assay can detect is 138 copies/mL. A negative result does not preclude SARS-Cov-2 infection and should not be used as the sole basis for treatment or other patient management decisions. A negative result may occur with  improper specimen collection/handling, submission of specimen other than nasopharyngeal swab, presence of viral mutation(s) within the areas targeted by this assay, and inadequate number of viral copies(<138 copies/mL). A negative result must be combined with clinical observations, patient history, and epidemiological information. The expected result is Negative.  Fact Sheet for Patients:  EntrepreneurPulse.com.au  Fact Sheet for Healthcare Providers:  IncredibleEmployment.be  This test is no t yet approved or cleared by the [place] FDA and  has been authorized for detection and/or diagnosis of SARS-CoV-2 by FDA under an Emergency Use Authorization (EUA). This EUA will remain  in effect (meaning this test can be used) for the duration of the COVID-19 declaration under Section 564(b)(1) of the Act, 21 U.S.C.section 360bbb-3(b)(1), unless the authorization is terminated  or revoked sooner.       Influenza A by PCR NEGATIVE NEGATIVE Final   Influenza B by PCR NEGATIVE NEGATIVE Final    Comment: (NOTE) The Xpert Xpress SARS-CoV-2/FLU/RSV plus assay is intended as an aid in the diagnosis of influenza from Nasopharyngeal swab specimens and should not be used as a sole basis for treatment. Nasal washings and aspirates are unacceptable for Xpert Xpress SARS-CoV-2/FLU/RSV testing.  Fact Sheet for Patients: EntrepreneurPulse.com.au  Fact Sheet for Healthcare Providers: IncredibleEmployment.be  This test is not yet approved or cleared by the [place] FDA and has been authorized for detection and/or diagnosis of SARS-CoV-2 by FDA under  an Emergency Use Authorization (EUA). This EUA will remain in effect (meaning this test can be used) for the duration of the COVID-19 declaration under Section 564(b)(1) of the Act, 21 U.S.C.  section 360bbb-3(b)(1), unless the authorization is terminated or revoked.  Performed at [facility], [place] [address] [place], [place] [address]          Radiology Studies: No results found.      Scheduled Meds: . insulin aspart  0-15 Units Subcutaneous TID WC  . insulin aspart  0-5 Units Subcutaneous QHS  . metoprolol tartrate  12.5 mg Oral BID  . pantoprazole (PROTONIX) IV  40 mg Intravenous Q12H   Continuous Infusions: . sodium chloride    . dextrose 5 % and 0.45% NaCl 100 mL/hr at 02/15/21 0634     LOS: 5 days   Time spent: 25 mins Greater than 50% of this time was spent in counseling, explanation of diagnosis, planning of further management, and coordination of care.   Voice Recognition Viviann Spare dictation system was used to create this note, attempts have been made to correct errors. Please contact the author with questions and/or clarifications.   Florencia Reasons, MD PhD FACP [facility]  Available via Epic secure chat 7am-7pm for nonurgent issues Please page for urgent issues To page the attending provider between 7A-7P or the covering provider during after hours 7P-7A, please log into the web site [email or web address] and access using universal [place] password for that web site. If you do not have the password, please call the hospital operator.    02/15/2021, 4:16 PM

## 2021-02-16 DIAGNOSIS — D649 Anemia, unspecified: Secondary | ICD-10-CM | POA: Diagnosis not present

## 2021-02-16 DIAGNOSIS — Z515 Encounter for palliative care: Secondary | ICD-10-CM | POA: Diagnosis not present

## 2021-02-16 DIAGNOSIS — R531 Weakness: Secondary | ICD-10-CM | POA: Diagnosis not present

## 2021-02-16 DIAGNOSIS — Z7189 Other specified counseling: Secondary | ICD-10-CM

## 2021-02-16 LAB — CBC
HCT: 25.4 % — ABNORMAL LOW (ref 36.0–46.0)
Hemoglobin: 8 g/dL — ABNORMAL LOW (ref 12.0–15.0)
MCH: 23 pg — ABNORMAL LOW (ref 26.0–34.0)
MCHC: 31.5 g/dL (ref 30.0–36.0)
MCV: 73 fL — ABNORMAL LOW (ref 80.0–100.0)
Platelets: 102 10*3/uL — ABNORMAL LOW (ref 150–400)
RBC: 3.48 MIL/uL — ABNORMAL LOW (ref 3.87–5.11)
RDW: 26.3 % — ABNORMAL HIGH (ref 11.5–15.5)
WBC: 5.2 10*3/uL (ref 4.0–10.5)
nRBC: 0 % (ref 0.0–0.2)

## 2021-02-16 LAB — COMPREHENSIVE METABOLIC PANEL
ALT: 17 U/L (ref 0–44)
AST: 22 U/L (ref 15–41)
Albumin: 1.7 g/dL — ABNORMAL LOW (ref 3.5–5.0)
Alkaline Phosphatase: 42 U/L (ref 38–126)
Anion gap: 3 — ABNORMAL LOW (ref 5–15)
BUN: 8 mg/dL (ref 8–23)
CO2: 23 mmol/L (ref 22–32)
Calcium: 7.3 mg/dL — ABNORMAL LOW (ref 8.9–10.3)
Chloride: 111 mmol/L (ref 98–111)
Creatinine, Ser: 1.36 mg/dL — ABNORMAL HIGH (ref 0.44–1.00)
GFR, Estimated: 37 mL/min — ABNORMAL LOW (ref >60)
Glucose, Bld: 71 mg/dL (ref 70–99)
Potassium: 3.1 mmol/L — ABNORMAL LOW (ref 3.5–5.1)
Sodium: 137 mmol/L (ref 135–145)
Total Bilirubin: 0.6 mg/dL (ref 0.3–1.2)
Total Protein: 4.8 g/dL — ABNORMAL LOW (ref 6.5–8.1)

## 2021-02-16 LAB — GLUCOSE, CAPILLARY
Glucose-Capillary: 64 mg/dL — ABNORMAL LOW (ref 70–99)
Glucose-Capillary: 72 mg/dL (ref 70–99)

## 2021-02-16 LAB — MAGNESIUM: Magnesium: 1.7 mg/dL (ref 1.7–2.4)

## 2021-02-16 LAB — CULTURE, BLOOD (ROUTINE X 2): Special Requests: ADEQUATE

## 2021-02-16 MED ORDER — POTASSIUM CHLORIDE 10 MEQ/100ML IV SOLN: 10.0000 meq | INTRAVENOUS | Status: DC

## 2021-02-16 MED ORDER — POTASSIUM CHLORIDE 20 MEQ PO PACK
40.0000 meq | PACK | Freq: Every day | ORAL | Status: DC
  Administered 2021-02-17 – 2021-02-18 (×2): 40 meq via ORAL
  Filled 2021-02-16 (×2): qty 2

## 2021-02-16 MED ORDER — MAGNESIUM SULFATE 2 GM/50ML IV SOLN: 2.0000 g | Freq: Once | INTRAVENOUS | Status: DC

## 2021-02-16 MED ORDER — LORAZEPAM 2 MG/ML IJ SOLN
0.5000 mg | INTRAMUSCULAR | Status: DC | PRN
  Administered 2021-02-16 – 2021-02-18 (×3): 0.5 mg via INTRAVENOUS
  Filled 2021-02-16 (×3): qty 1

## 2021-02-16 MED ORDER — QUETIAPINE FUMARATE 25 MG PO TABS
25.0000 mg | ORAL_TABLET | Freq: Two times a day (BID) | ORAL | Status: DC
  Administered 2021-02-16 – 2021-02-17 (×3): 25 mg via ORAL
  Filled 2021-02-16 (×3): qty 1

## 2021-02-16 MED ORDER — LORAZEPAM 0.5 MG PO TABS
0.5000 mg | ORAL_TABLET | ORAL | Status: DC | PRN
  Filled 2021-02-16: qty 1

## 2021-02-16 MED ORDER — MAGNESIUM OXIDE -MG SUPPLEMENT 400 (240 MG) MG PO TABS
400.0000 mg | ORAL_TABLET | Freq: Every day | ORAL | Status: AC
  Administered 2021-02-17: 400 mg via ORAL
  Filled 2021-02-16: qty 1

## 2021-02-16 NOTE — Progress Notes (Addendum)
Pt pleasantly confused. Refusing the PIV. Explained the need for a PIV with the pt refusing. Has already had 6 PIV's and pt pulling them out. Spoke to the primary RN who states she will contact the attending MD for further direction.

## 2021-02-16 NOTE — Progress Notes (Addendum)
PROGRESS NOTE    Laura GALLOGLY  CVE:[id number] DOB: [birth year] DOA: 02/10/2021 PCP: Charolette Forward, MD    Chief Complaint  Patient presents with  . Failure To Thrive    Brief Narrative:  Chief Complaint: Weakness and pain all over  87 year old female with history of recurrent GI bleed, diabetes mellitus type 2, hypertension, CAD, dementia, history of CVA presented with weakness and generalized pain.  In the ED patient was found to have hemoglobin of 6.7.  Also she had evidence of UTI.  FOBT was positive in the ED.  BUN/creatinine was 30/3.03  GI and palliative care consulted  Subjective:   she does not eat much,she pulled out iv 6 times, she won't cooperate with midline placement HPOA daughter has discussed with palliative care, she is ready to move forward with residential hospice placement, social worker informed  Demented elderly, no agitation currently, she recognized his son from out of town, she is not have unlimited visits She is not oriented to time , she knows she is in the hospital  She denies abdominal pain Currently on room air Family  At bedside   Assessment & Plan:   Principal Problem:   Symptomatic anemia Active Problems:   Abdominal pain   Essential hypertension   HLD (hyperlipidemia)   CKD (chronic kidney disease), stage III ([place])   Diabetes mellitus with complication ([place])   UTI   Leucocytosis  Symptomatic anemia-secondary to GI bleed.  FOBT was positive.  Hemoglobin was 6.7 on admission, she received 2 units PRBC.  Hemoglobin increased to 7.4 yesterday, trending down again this morning, another PRBC given on 5/25 Case discussed with GI, GI recommended palliative care consult Palliative care input appreciated, HPOA [name] confirmed to move forward with residential  hospice    AKI on CKD 3B -BUN 38 creatinine 2.03 on presentation -BUN 9 creatinine 1.22 on 5/27 -She was given antibiotic initially for possible UTI, urine culture multiple species,  blood culture no growth, antibiotic was discontinued -was on continous  hydration as oral intake is very poor  Hypoglycemia Was on Continue d5, regular diet She pulled out IV 6 times, will incorporate with midline insertion, HPOA [name] confirmed to move forward with residential  hospice  Hypokalemia/hypomagnesemia Replaced K/mag  Hypertension- antihypertensive medications held since admission, bp start to trend up, resume low dose lopressor  Dementia-patient is not on meds PTA,   FTT, goals of care discussion, Palliative care input appreciated, HPOA decided residential  hospice , social worker consulted      Unresulted Labs (From admission, onward)          Start     Ordered   02/17/21 0500  Creatinine, serum  (enoxaparin (LOVENOX)    CrCl >/= 30 ml/min)  Weekly,   R     Comments: while on enoxaparin therapy    02/11/21 0327            DVT prophylaxis: Place and maintain sequential compression device Start: 02/11/21 0328   Code Status: DNR/DNI Family Communication: daughter over the phone on 5/27 and 5/28  Disposition:   Status is: Inpatient  Dispo: The patient is from: home              Anticipated d/c is to: residential hospice              Anticipated d/c date is: residential   hospice                Consultants:   GI  palliative care  Procedures:   Planned EGD aborted on 5/24  Antimicrobials:    Anti-infectives (From admission, onward)   Start     Dose/Rate Route Frequency Ordered Stop   02/11/21 2000  cefTRIAXone (ROCEPHIN) 1 g in sodium chloride 0.9 % 100 mL IVPB  Status:  Discontinued        1 g 200 mL/hr over 30 Minutes Intravenous Every 24 hours 02/11/21 0327 02/12/21 1223   02/10/21 2000  cefTRIAXone (ROCEPHIN) 1 g in sodium chloride 0.9 % 100 mL IVPB        1 g 200 mL/hr over 30 Minutes Intravenous  Once 02/10/21 1949 02/10/21 2219         Objective: Vitals:   02/15/21 0809 02/15/21 1710 02/15/21 2101 02/16/21 0429  BP: (!)  152/77 (!) 141/81 (!) 132/47 (!) 156/62  Pulse: 76 64  80  Resp:   18 18  Temp: 98.4 F (36.9 C) 98.1 F (36.7 C) 99.5 F (37.5 C) 98.3 F (36.8 C)  TempSrc: Oral Oral Oral Oral  SpO2: 100% 98% 100% 100%    Intake/Output Summary (Last 24 hours) at 02/16/2021 0851 Last data filed at 02/16/2021 0500 Gross per 24 hour  Intake 900 ml  Output --  Net 900 ml   There were no vitals filed for this visit.  Examination:  General exam: demented elderly, frail, recognize some of her family members, currently calm, NAD Respiratory system: Clear to auscultation. Respiratory effort normal. Cardiovascular system: S1 & S2 heard, RRR. No JVD, no murmur, No pedal edema. Gastrointestinal system: Abdomen is nondistended, soft and nontender. Normal bowel sounds heard. Central nervous system: Alert, demented, moving all extremities Extremities: Generalized weakness, moving all extremities, no edema Skin: No rashes, lesions or ulcers Psychiatry: Demented    Data Reviewed: I have personally reviewed following labs and imaging studies  CBC: Recent Labs  Lab 02/10/21 1834 02/11/21 0826 02/13/21 1037 02/15/21 0553 02/16/21 0512  WBC 13.6* 17.7* 9.6 5.7 5.2  NEUTROABS 11.4*  --   --   --   --   HGB 6.7* 7.4* 6.4* 8.0* 8.0*  HCT 22.4* 24.7* 20.4* 26.3* 25.4*  MCV 64.2* 70.8* 66.2* 74.7* 73.0*  PLT 181 167 133* 129* 102*    Basic Metabolic Panel: Recent Labs  Lab 02/10/21 1834 02/13/21 0917 02/15/21 0553 02/16/21 0512  NA 140 140 138 137  K 4.7 3.6 3.0* 3.1*  CL 110 112* 110 111  CO2 22 22 22 23   GLUCOSE 90 93 87 71  BUN 38* 18 9 8   CREATININE 2.03* 1.42* 1.22* 1.36*  CALCIUM 8.0* 7.6* 7.6* 7.3*  MG  --   --   --  1.7    GFR: CrCl cannot be calculated (Unknown ideal weight.).  Liver Function Tests: Recent Labs  Lab 02/10/21 1834 02/13/21 0917 02/16/21 0512  AST 46* 44* 22  ALT 12 20 17   ALKPHOS 60 48 42  BILITOT 0.9 0.6 0.6  PROT 6.6 5.4* 4.8*  ALBUMIN 2.5* 2.0*  1.7*    CBG: Recent Labs  Lab 02/14/21 2102 02/15/21 0804 02/15/21 1149 02/15/21 1640 02/16/21 0739  GLUCAP 100* 90 83 107* 64*     Recent Results (from the past 240 hour(s))  Blood culture (routine x 2)     Status: None   Collection Time: 02/10/21  5:33 PM   Specimen: BLOOD  Result Value Ref Range Status   Specimen Description   Final    BLOOD BLOOD RIGHT FOREARM Performed at [facility]  [place] [address], [place], [place] [address]    Special Requests   Final    BOTTLES DRAWN AEROBIC AND ANAEROBIC Blood Culture adequate volume Performed at [place] [address], [place], [place] [address]    Culture   Final    NO GROWTH 5 DAYS Performed at [facility], [place] [address] [place], [place] [address]    Report Status 02/15/2021 FINAL  Final  Urine Culture     Status: Abnormal   Collection Time: 02/10/21  5:34 PM   Specimen: Urine, Random  Result Value Ref Range Status   Specimen Description   Final    URINE, RANDOM Performed at [place] [address], [place], [place] [address]    Special Requests   Final    NONE Performed at [facility], [place] [address] [place], [place] [address]    Culture MULTIPLE SPECIES PRESENT, SUGGEST RECOLLECTION (A)  Final   Report Status 02/12/2021 FINAL  Final  Blood culture (routine x 2)     Status: None   Collection Time: 02/10/21  5:38 PM   Specimen: BLOOD  Result Value Ref Range Status   Specimen Description   Final    BLOOD BLOOD LEFT HAND Performed at [facility] [address] [place], [place] [address]    Special Requests   Final    BOTTLES DRAWN AEROBIC AND ANAEROBIC Blood Culture adequate volume Performed at [place] [address], [place], [place] [address]    Culture   Final    NO GROWTH 5 DAYS Performed at [facility], [place] [address] [place], [place] [address]    Report  Status 02/16/2021 FINAL  Final  Resp Panel by RT-PCR (Flu A&B, Covid) Nasopharyngeal Swab     Status: None   Collection Time: 02/10/21 11:00 PM   Specimen: Nasopharyngeal Swab; Nasopharyngeal(NP) swabs in vial transport medium  Result Value Ref Range Status   SARS Coronavirus 2 by RT PCR NEGATIVE NEGATIVE Final    Comment: (NOTE) SARS-CoV-2 target nucleic acids are NOT DETECTED.  The SARS-CoV-2 RNA is generally detectable in upper respiratory specimens during the acute phase of infection. The lowest concentration of SARS-CoV-2 viral copies this assay can detect is 138 copies/mL. A negative result does not preclude SARS-Cov-2 infection and should not be used as the sole basis for treatment or other patient management decisions. A negative result may occur with  improper specimen collection/handling, submission of specimen other than nasopharyngeal swab, presence of viral mutation(s) within the areas targeted by this assay, and inadequate number of viral copies(<138 copies/mL). A negative result must be combined with clinical observations, patient history, and epidemiological information. The expected result is Negative.  Fact Sheet for Patients:  EntrepreneurPulse.com.au  Fact Sheet for Healthcare Providers:  IncredibleEmployment.be  This test is no t yet approved or cleared by the [place] FDA and  has been authorized for detection and/or diagnosis of SARS-CoV-2 by FDA under an Emergency Use Authorization (EUA). This EUA will remain  in effect (meaning this test can be used) for the duration of the COVID-19 declaration under Section 564(b)(1) of the Act, 21 U.S.C.section 360bbb-3(b)(1), unless the authorization is terminated  or revoked sooner.       Influenza A by PCR NEGATIVE NEGATIVE Final   Influenza B by PCR NEGATIVE NEGATIVE Final    Comment: (NOTE) The Xpert Xpress SARS-CoV-2/FLU/RSV plus assay is intended as an  aid in the  diagnosis of influenza from Nasopharyngeal swab specimens and should not be used as a sole basis for treatment. Nasal washings and aspirates are unacceptable for Xpert Xpress SARS-CoV-2/FLU/RSV testing.  Fact Sheet for Patients: EntrepreneurPulse.com.au  Fact Sheet for Healthcare Providers: IncredibleEmployment.be  This test is not yet approved or cleared by the [place] FDA and has been authorized for detection and/or diagnosis of SARS-CoV-2 by FDA under an Emergency Use Authorization (EUA). This EUA will remain in effect (meaning this test can be used) for the duration of the COVID-19 declaration under Section 564(b)(1) of the Act, 21 U.S.C. section 360bbb-3(b)(1), unless the authorization is terminated or revoked.  Performed at [facility], [place] [address], [place], [place] [address]          Radiology Studies: No results found.      Scheduled Meds: . insulin aspart  0-15 Units Subcutaneous TID WC  . insulin aspart  0-5 Units Subcutaneous QHS  . metoprolol tartrate  12.5 mg Oral BID  . pantoprazole (PROTONIX) IV  40 mg Intravenous Q12H   Continuous Infusions: . sodium chloride    . dextrose 5 % and 0.45% NaCl 100 mL/hr at 02/15/21 0634  . magnesium sulfate bolus IVPB    . potassium chloride       LOS: 6 days   Time spent: 25 mins Greater than 50% of this time was spent in counseling, explanation of diagnosis, planning of further management, and coordination of care.   Voice Recognition [place] Spare dictation system was used to create this note, attempts have been made to correct errors. Please contact the author with questions and/or clarifications.   Florencia Reasons, MD PhD FACP [facility]  Available via Epic secure chat 7am-7pm for nonurgent issues Please page for urgent issues To page the attending provider between 7A-7P or the covering provider during after hours 7P-7A, please log into the web  site [email or web address] and access using universal [place] password for that web site. If you do not have the password, please call the hospital operator.    02/16/2021, 8:51 AM

## 2021-02-16 NOTE — Progress Notes (Signed)
Daily Progress Note   [name]       Date: 02/16/2021 DOB: [birth year]  Age: 87 y.o. MRN#: [id number] Attending Physician: Florencia Reasons, MD Primary Care Physician: Charolette Forward, MD Admit Date: 02/10/2021  Reason for Consultation/Follow-up: Establishing goals of care  Subjective:  " I don't know." Patient resting in chair, PO intake is minimal to nil, denies pain, appears chronically ill with generalized weakness. States "I don't know" to all questions asked.   Length of Stay: 6  Current Medications: Scheduled Meds:  . insulin aspart  0-15 Units Subcutaneous TID WC  . insulin aspart  0-5 Units Subcutaneous QHS  . metoprolol tartrate  12.5 mg Oral BID  . pantoprazole (PROTONIX) IV  40 mg Intravenous Q12H    Continuous Infusions: . sodium chloride    . dextrose 5 % and 0.45% NaCl 100 mL/hr at 02/15/21 0634  . magnesium sulfate bolus IVPB    . potassium chloride      PRN Meds: acetaminophen **OR** acetaminophen, lip balm, ondansetron **OR** ondansetron (ZOFRAN) IV  Physical Exam         Resting in chair Generalized weakness Chronically ill appearing Is frail Regular shallow breath sounds S 1 S 2  Abdomen is with mild tenderness No edema  Vital Signs: BP (!) 156/62 (BP Location: Right Arm)   Pulse 80   Temp 98.3 F (36.8 C) (Oral)   Resp 18   SpO2 100%  SpO2: SpO2: 100 % O2 Device: O2 Device: Room Air O2 Flow Rate: O2 Flow Rate (L/min): 2 L/min  Intake/output summary:   Intake/Output Summary (Last 24 hours) at 02/16/2021 0932 Last data filed at 02/16/2021 0900 Gross per 24 hour  Intake 900 ml  Output --  Net 900 ml   LBM: Last BM Date: 02/11/21 Baseline Weight:   Most recent weight:         Palliative Assessment/Data: PPS  30%     Patient Active Problem List   Diagnosis Date Noted  . Leucocytosis 02/10/2021  . Bilateral carotid artery stenosis 07/01/2018  . Spinal stenosis of cervical region 07/01/2018  . Symptomatic anemia 06/16/2018  . UTI 02/20/2017  . Hypoglycemia 02/19/2017  . Acute lower UTI 02/19/2017  . Nausea & vomiting 02/19/2017  . Abdominal pain 02/19/2017  . Essential hypertension 02/19/2017  .  HLD (hyperlipidemia) 02/19/2017  . AKI (acute kidney injury) ([place]) 02/19/2017  . CKD (chronic kidney disease), stage III ([place]) 02/19/2017  . Hypoglycemia associated with diabetes ([place]) 02/19/2017  . Diabetes mellitus with complication ([place]) 26/20/3559  . Acute lower GI bleeding 10/08/2015    Palliative Care Assessment & Plan   Patient Profile:   87 year old female with history of recurrent GI bleed, diabetes mellitus type 2, hypertension, CAD, dementia, history of CVA presented with weakness and generalized pain. In the ED patient was found to have hemoglobin of 6.7. Also she had evidence of UTI. FOBT was positive in the ED. BUN/creatinine was 30/3.03 Assessment:  history of recurrent GI bleeds Minimal to nil PO intake Hypoglycemia Dementia Functional and cognitive decline   Recommendations/Plan:  Call placed and discussed with daughter [name]: patient remains at high risk for ongoing decline and decompensation, she could have recurrent GI bleed, ongoing functional decline, minimal PO intake. Due to dementia, patient not able to verbalize symptom needs, monitoring for non verbal signs of distress or discomfort.  Goals of care discussed, disposition options discussed. differences between residential hospice and home with hospice explained. All of [name]'s questions addressed to the best of my ability.  Would recommend residential hospice, TOC consulted, [name] is aware that no PRBC inside residential hospice and we talked about scope of comfort measures.     Goals of Care and  Additional Recommendations:  Limitations on Scope of Treatment: Full Comfort Care  Code Status:    Code Status Orders  (From admission, onward)         Start     Ordered   02/14/21 0934  Do not attempt resuscitation (DNR)  Continuous       Question Answer Comment  In the event of cardiac or respiratory ARREST Do not call a "code blue"   In the event of cardiac or respiratory ARREST Do not perform Intubation, CPR, defibrillation or ACLS   In the event of cardiac or respiratory ARREST Use medication by any route, position, wound care, and other measures to relive pain and suffering. May use oxygen, suction and manual treatment of airway obstruction as needed for comfort.      02/14/21 0933        Code Status History    Date Active Date Inactive Code Status Order ID Comments User Context   02/11/2021 0328 02/14/2021 0933 Full Code [id number]  Elwyn Reach, MD Inpatient   06/16/2018 1927 06/17/2018 1613 Full Code [id number]  Charolette Forward, MD Inpatient   02/19/2017 1253 02/20/2017 1842 Full Code [id number]  Waldemar Dickens, MD ED   10/08/2015 2149 10/10/2015 2128 Full Code [id number]  Dixie Dials, MD ED   Advance Care Planning Activity       Prognosis:   < 2 weeks  Discharge Planning:  Hospice facility  Care plan was discussed with  Patient, also discussed with daughter [name] on the phone.   Thank you for allowing the Palliative Medicine Team to assist in the care of this patient.   Time In: 9 Time Out: 9.35 Total Time 35 Prolonged Time Billed  no       Greater than 50%  of this time was spent counseling and coordinating care related to the above assessment and plan.  Loistine Chance, MD  Please contact Palliative Medicine Team phone at [phone] for questions and concerns.

## 2021-02-16 NOTE — Progress Notes (Signed)
IV site assessed. Flushed easily with NS and dressing secure. No s/s of leaking, phlebitis or infiltration. Spoke with Dr. Erlinda Hong regarding the midline order. Explained the pt is confused and refusing any further IV sticks, so a midline is not possible at this time. MD ordered Ativan PRN if the current PIV is unusable and a PIV/midline is needed. Primary RN aware.

## 2021-02-16 NOTE — TOC Initial Note (Addendum)
Transition of Care [facility]) - Initial/Assessment Note    Patient Details  Name: [name] MRN: [id number] Date of Birth: 09/18/1931  [facility] CM/SW Contact:    Iona Beard, [place] Phone Number: 02/16/2021, 12:08 PM  Clinical Narrative:                 TOC contacted for need for residential hospice for pt. CSW spoke with pts daughter [name] who states that all siblings are agreeing that residential hospice is what they prefer. Ms. Brener is agreeable to referral being made to [facility] for residential hospice. CSW spoke to Margaretmary Eddy with [facility] who accepts referral and states they will get to work on things and they will reach out to pts family. TOC to follow.   Addendum 4:30pm: CSW updated by Lonia Chimera that pt is not a canidate for residdential hospice. The plan is now for pt to go home with outpatient palliative services and family hiring care givers. TOC to continue following for needs.   Expected Discharge Plan: [place] Barriers to Discharge: Continued Medical Work up   Patient Goals and CMS Choice Patient states their goals for this hospitalization and ongoing recovery are:: residential hospice CMS Medicare.gov Compare Post Acute Care list provided to:: Patient Represenative (must comment) Choice offered to / list presented to : Adult Children  Expected Discharge Plan and Services Expected Discharge Plan: [place] In-house Referral: Clinical Social Work Discharge Planning Services: CM Consult Post Acute Care Choice: Hospice Living arrangements for the past 2 months: Single Family Home                 DME Arranged: N/A DME Agency: NA       HH Arranged: NA HH Agency: NA        Prior Living Arrangements/Services Living arrangements for the past 2 months: [place] with:: Self Patient language and need for interpreter reviewed:: Yes Do you feel safe going back to the place where you live?:  Yes      Need for Family Participation in Patient Care: Yes (Comment) Care giver support system in place?: Yes (comment)   Criminal Activity/Legal Involvement Pertinent to Current Situation/Hospitalization: No - Comment as needed  Activities of Daily Living Home Assistive Devices/Equipment: Dentures (specify type) (upper/lower dentures) ADL Screening (condition at time of admission) Patient's cognitive ability adequate to safely complete daily activities?: No Is the patient deaf or have difficulty hearing?: No Does the patient have difficulty seeing, even when wearing glasses/contacts?: No Does the patient have difficulty concentrating, remembering, or making decisions?: Yes Patient able to express need for assistance with ADLs?: Yes Does the patient have difficulty dressing or bathing?: Yes Independently performs ADLs?: No Communication: Independent Dressing (OT): Dependent Is this a change from baseline?: Change from baseline, expected to last >3 days Grooming: Dependent Is this a change from baseline?: Change from baseline, expected to last >3 days Feeding: Dependent Is this a change from baseline?: Change from baseline, expected to last >3 days Bathing: Dependent Is this a change from baseline?: Change from baseline, expected to last >3 days Toileting: Dependent Is this a change from baseline?: Change from baseline, expected to last >3days In/Out Bed: Dependent Is this a change from baseline?: Change from baseline, expected to last >3 days Walks in Home: Dependent Is this a change from baseline?: Change from baseline, expected to last >3 days Does the patient have difficulty walking or climbing stairs?: Yes Weakness of Legs: Both Weakness  of Arms/Hands: Both  Permission Sought/Granted                  Emotional Assessment   Attitude/Demeanor/Rapport: Unable to Assess Affect (typically observed): Unable to Assess Orientation: : Fluctuating Orientation (Suspected  and/or reported Sundowners) Alcohol / Substance Use: Not Applicable Psych Involvement: No (comment)  Admission diagnosis:  Symptomatic anemia [D64.9] Urinary tract infection without hematuria, site unspecified [N39.0] Anemia, unspecified type [D64.9] Sepsis, due to unspecified organism, unspecified whether acute organ dysfunction present [facility]) [A41.9] Patient Active Problem List   Diagnosis Date Noted  . Leucocytosis 02/10/2021  . Bilateral carotid artery stenosis 07/01/2018  . Spinal stenosis of cervical region 07/01/2018  . Symptomatic anemia 06/16/2018  . UTI 02/20/2017  . Hypoglycemia 02/19/2017  . Acute lower UTI 02/19/2017  . Nausea & vomiting 02/19/2017  . Abdominal pain 02/19/2017  . Essential hypertension 02/19/2017  . HLD (hyperlipidemia) 02/19/2017  . AKI (acute kidney injury) ([place]) 02/19/2017  . CKD (chronic kidney disease), stage III ([place]) 02/19/2017  . Hypoglycemia associated with diabetes ([place]) 02/19/2017  . Diabetes mellitus with complication ([place]) 44/81/8563  . Acute lower GI bleeding 10/08/2015   PCP:  Charolette Forward, MD Pharmacy:   [id number] [address], [place] [address] Phone: [phone] Fax: [phone]     Social Determinants of Health (SDOH) Interventions    Readmission Risk Interventions No flowsheet data found.

## 2021-02-16 NOTE — Progress Notes (Signed)
[facility]) Hospital Liaison RN note  Received referral from [place], [facility] for family interest in [facility].  Visited with patient in room. Patient was just transferring from being up in chair for 2 hours this morning. Lunch tray with 80-90% eaten. NA reports patient ambulates to bathroom, is continent of bowel and bladder. Patient requesting to go home, now. Attempting to get out of bed. Grandaughters at bedside report patient's sons reside with the patient in her home. They state she cared for herself prior to admission and the reason she came to the hospital was for weakness.  Patient does not appear to be inpatient hospice appropriate at this time. Phone discussion with Dr. Rowe Pavy who agrees this is a very different clinical picture than she observed. She is agreeable to have [place] evaluate for hospice at home or outpatient palliative care.  Phone call to daughter [name] to discuss. [name] is confused with the differing information she is receiving. She expresses concern that she was told patient would require 24/7 care and she and her brothers cannot provide this while working. We discussed if [name] would want her mother to receive blood transfusions should she have a bleed again. [name] states that she would want to attempt blood transfusions in the hope that bleeding would stop as she understands patient is not a candidate for surgical intervention. We discussed further hospitalizations and treatments and that outpatient palliative may be a more appropriate approach to care at this time.  Phone call with Dr. Erlinda Hong to discuss above. She requests we continue to follow to see how patient does. Dr. Phoebe Sharps concerns are patients oral intake and if she will take in enough to sustain herself. Daughter reports patient eats very poorly at home and has had significant recent weight loss. Current weight in chart appears to be from some distant time ago, have requested staff to obtain  current weight. Patient is refusing to cooperate with obtaining weight right now.  Pt is desiring to go home. Dr. Erlinda Hong spoke with daughter and is awaiting return phone call to determine if discharge will occur tonight with palliative to follow. Dr. Erlinda Hong shares that [name] does not wish for patient to go to SNF and with family and hired help they could care for patient at home.  Awaiting return phone call from Dr. Erlinda Hong to determine outpatient referral needs (palliative or hospice).  Please call with any hospice/outpatient palliative questions or concerns.  Thank you, Margaretmary Eddy, BSN, RN [facility] Liaison [phone]

## 2021-02-17 DIAGNOSIS — D649 Anemia, unspecified: Secondary | ICD-10-CM | POA: Diagnosis not present

## 2021-02-17 LAB — CBC
HCT: 31.4 % — ABNORMAL LOW (ref 36.0–46.0)
Hemoglobin: 10.1 g/dL — ABNORMAL LOW (ref 12.0–15.0)
MCH: 22.9 pg — ABNORMAL LOW (ref 26.0–34.0)
MCHC: 32.2 g/dL (ref 30.0–36.0)
MCV: 71.2 fL — ABNORMAL LOW (ref 80.0–100.0)
Platelets: 102 10*3/uL — ABNORMAL LOW (ref 150–400)
RBC: 4.41 MIL/uL (ref 3.87–5.11)
RDW: 26.6 % — ABNORMAL HIGH (ref 11.5–15.5)
WBC: 6.1 10*3/uL (ref 4.0–10.5)
nRBC: 0 % (ref 0.0–0.2)

## 2021-02-17 LAB — BASIC METABOLIC PANEL
Anion gap: 4 — ABNORMAL LOW (ref 5–15)
BUN: 9 mg/dL (ref 8–23)
CO2: 24 mmol/L (ref 22–32)
Calcium: 7.6 mg/dL — ABNORMAL LOW (ref 8.9–10.3)
Chloride: 111 mmol/L (ref 98–111)
Creatinine, Ser: 1.1 mg/dL — ABNORMAL HIGH (ref 0.44–1.00)
GFR, Estimated: 48 mL/min — ABNORMAL LOW (ref >60)
Glucose, Bld: 62 mg/dL — ABNORMAL LOW (ref 70–99)
Potassium: 3.4 mmol/L — ABNORMAL LOW (ref 3.5–5.1)
Sodium: 139 mmol/L (ref 135–145)

## 2021-02-17 LAB — MAGNESIUM: Magnesium: 1.7 mg/dL (ref 1.7–2.4)

## 2021-02-17 LAB — TSH: TSH: 2.636 u[IU]/mL (ref 0.350–4.500)

## 2021-02-17 MED ORDER — QUETIAPINE FUMARATE 25 MG PO TABS: 25.0000 mg | ORAL_TABLET | Freq: Every day | ORAL | Status: DC

## 2021-02-17 MED ORDER — DEXTROSE IN LACTATED RINGERS 5 % IV SOLN: INTRAVENOUS | Status: DC

## 2021-02-17 MED ORDER — MIRTAZAPINE 15 MG PO TABS
7.5000 mg | ORAL_TABLET | Freq: Every day | ORAL | Status: DC
  Administered 2021-02-17: 7.5 mg via ORAL
  Filled 2021-02-17: qty 1

## 2021-02-17 MED ORDER — MAGNESIUM SULFATE 2 GM/50ML IV SOLN
2.0000 g | Freq: Once | INTRAVENOUS | Status: AC
  Administered 2021-02-17: 2 g via INTRAVENOUS
  Filled 2021-02-17: qty 50

## 2021-02-17 NOTE — Progress Notes (Signed)
PROGRESS NOTE    [name]  IRJ:[id number] DOB: [birth year] DOA: 02/10/2021 PCP: Charolette Forward, MD    Chief Complaint  Patient presents with  . Failure To Thrive    Brief Narrative:  Chief Complaint: Weakness and pain all over  87 year old female with history of recurrent GI bleed, diabetes mellitus type 2, hypertension, CAD, dementia, history of CVA presented with weakness and generalized pain.  In the ED patient was found to have hemoglobin of 6.7.  Also she had evidence of UTI.  FOBT was positive in the ED.  BUN/creatinine was 30/3.03  GI and palliative care consulted  Subjective:   she does not eat much, only consume 10% of her  meal at best, a.m. blood glucose 71  Demented elderly, no agitation currently,  She is not oriented to time , she knows she is in the hospital  She denies abdominal pain Currently on room air   Assessment & Plan:   Principal Problem:   Symptomatic anemia Active Problems:   Abdominal pain   Essential hypertension   HLD (hyperlipidemia)   CKD (chronic kidney disease), stage III (HCC)   Diabetes mellitus with complication (HCC)   UTI   Leucocytosis  Symptomatic anemia-secondary to GI bleed/acute blood loss anemia - FOBT was positive.  Hemoglobin was 6.7 on admission, s/p 3 units PRBC.  Case discussed with GI,  Who do not think she can tolerate endoscopic procedure, GI recommended palliative care consult Palliative care input appreciated, HPOA [name] confirmed to move forward with home  hospice , if patient does not eat much, likely need to transition to residential hospice from home   AKI on CKD 3B -BUN 38 creatinine 2.03 on presentation -BUN 9 creatinine 1.1 on 5/29 -She was given antibiotic initially for possible UTI, urine culture multiple species, blood culture no growth, antibiotic was discontinued -was on continous  hydration as oral intake is very poor  Hypoglycemia/from poor oral intake, consume less than 10% of her meals,  50% weight loss in the last few months per daughter Was on Continue d5, regular diet She pulled out IV 6 times, not able to corporate with midline insertion,  HPOA [name] confirmed to move forward with  Hospice,  daughter agrees a trial of  Remeron for appetite booster, daughter reports she tried to give patient ensure, but she does not drink it  Hypokalemia/hypomagnesemia Replaced K/mag, improved  Hypertension- antihypertensive medications held since admission, bp start to trend up, resume low dose lopressor  Dementia-patient is not on meds PTA, started seroquel nightly  FTT, goals of care discussion, Palliative care input appreciated, HPOA decided home  hospice , social worker consulted      Unresulted Labs (From admission, onward)          Start     Ordered   02/18/21 3016  Basic metabolic panel  Tomorrow morning,   R       Question:  Specimen collection method  Answer:  Lab=Lab collect   02/17/21 1730   02/18/21 0500  Magnesium  Tomorrow morning,   R       Question:  Specimen collection method  Answer:  Lab=Lab collect   02/17/21 1730            DVT prophylaxis: Place and maintain sequential compression device Start: 02/11/21 0328   Code Status: DNR/DNI Family Communication: daughter over the phone on 5/27 , 5/28 and 5/29  Disposition:   Status is: Inpatient  Dispo: The patient is from: home  Anticipated d/c is to: home hospice              Anticipated d/c date is: home   Hospice once hospital bed delivered on 5/30                Consultants:   GI   palliative care  hopsice   Procedures:   Planned EGD aborted on 5/24  Antimicrobials:    Anti-infectives (From admission, onward)   Start     Dose/Rate Route Frequency Ordered Stop   02/11/21 2000  cefTRIAXone (ROCEPHIN) 1 g in sodium chloride 0.9 % 100 mL IVPB  Status:  Discontinued        1 g 200 mL/hr over 30 Minutes Intravenous Every 24 hours 02/11/21 0327 02/12/21 1223   02/10/21  2000  cefTRIAXone (ROCEPHIN) 1 g in sodium chloride 0.9 % 100 mL IVPB        1 g 200 mL/hr over 30 Minutes Intravenous  Once 02/10/21 1949 02/10/21 2219         Objective: Vitals:   02/16/21 1417 02/16/21 2216 02/17/21 0548 02/17/21 1600  BP: (!) 149/66 (!) 149/56 (!) 156/55   Pulse: 84 68 63   Resp: 16 20 16    Temp: 98.3 F (36.8 C) 97.8 F (36.6 C) 98.2 F (36.8 C)   TempSrc:   Oral   SpO2: 100% 100% 100%   Weight:    42 kg  Height:    5\' 3"  (1.6 m)    Intake/Output Summary (Last 24 hours) at 02/17/2021 1732 Last data filed at 02/17/2021 1500 Gross per 24 hour  Intake 220 ml  Output --  Net 220 ml   Filed Weights   02/17/21 1600  Weight: 42 kg    Examination:  General exam: demented elderly, frail, weak, currently no agitation Respiratory system: Clear to auscultation. Respiratory effort normal. Cardiovascular system: S1 & S2 heard, RRR. No JVD, no murmur, No pedal edema. Gastrointestinal system: Abdomen is nondistended, soft and nontender. Normal bowel sounds heard. Central nervous system: Alert, demented, moving all extremities Extremities: Generalized weakness, moving all extremities, no edema Skin: No rashes, lesions or ulcers Psychiatry: Demented    Data Reviewed: I have personally reviewed following labs and imaging studies  CBC: Recent Labs  Lab 02/10/21 1834 02/11/21 0826 02/13/21 1037 02/15/21 0553 02/16/21 0512 02/17/21 0934  WBC 13.6* 17.7* 9.6 5.7 5.2 6.1  NEUTROABS 11.4*  --   --   --   --   --   HGB 6.7* 7.4* 6.4* 8.0* 8.0* 10.1*  HCT 22.4* 24.7* 20.4* 26.3* 25.4* 31.4*  MCV 64.2* 70.8* 66.2* 74.7* 73.0* 71.2*  PLT 181 167 133* 129* 102* 102*    Basic Metabolic Panel: Recent Labs  Lab 02/10/21 1834 02/13/21 0917 02/15/21 0553 02/16/21 0512 02/17/21 0545  NA 140 140 138 137 139  K 4.7 3.6 3.0* 3.1* 3.4*  CL 110 112* 110 111 111  CO2 22 22 22 23 24   GLUCOSE 90 93 87 71 62*  BUN 38* 18 9 8 9   CREATININE 2.03* 1.42* 1.22*  1.36* 1.10*  CALCIUM 8.0* 7.6* 7.6* 7.3* 7.6*  MG  --   --   --  1.7 1.7    GFR: Estimated Creatinine Clearance: 23.4 mL/min (A) (by C-G formula based on SCr of 1.1 mg/dL (H)).  Liver Function Tests: Recent Labs  Lab 02/10/21 1834 02/13/21 0917 02/16/21 0512  AST 46* 44* 22  ALT 12 20 17   ALKPHOS 60 48 42  BILITOT 0.9 0.6 0.6  PROT 6.6 5.4* 4.8*  ALBUMIN 2.5* 2.0* 1.7*    CBG: Recent Labs  Lab 02/15/21 0804 02/15/21 1149 02/15/21 1640 02/16/21 0739 02/16/21 0921  GLUCAP 90 83 107* 64* 72     Recent Results (from the past 240 hour(s))  Blood culture (routine x 2)     Status: None   Collection Time: 02/10/21  5:33 PM   Specimen: BLOOD  Result Value Ref Range Status   Specimen Description   Final    BLOOD BLOOD RIGHT FOREARM Performed at [facility], [place] [address], [place], [place] [address]    Special Requests   Final    BOTTLES DRAWN AEROBIC AND ANAEROBIC Blood Culture adequate volume Performed at [place] [address], [place], [place] [address]    Culture   Final    NO GROWTH 5 DAYS Performed at [facility], [place] [address] [place], [place] [address]    Report Status 02/15/2021 FINAL  Final  Urine Culture     Status: Abnormal   Collection Time: 02/10/21  5:34 PM   Specimen: Urine, Random  Result Value Ref Range Status   Specimen Description   Final    URINE, RANDOM Performed at [place] [address], [place], [place] [address]    Special Requests   Final    NONE Performed at [facility], [place] [address], [place], [place] [address]    Culture MULTIPLE SPECIES PRESENT, SUGGEST RECOLLECTION (A)  Final   Report Status 02/12/2021 FINAL  Final  Blood culture (routine x 2)     Status: None   Collection Time: 02/10/21  5:38 PM   Specimen: BLOOD  Result Value Ref Range Status   Specimen Description   Final    BLOOD BLOOD LEFT HAND Performed at [place] [address], [place], [place] [address]    Special Requests   Final    BOTTLES DRAWN AEROBIC AND ANAEROBIC Blood Culture adequate volume Performed at [place] [address], [place], [place] [address]    Culture   Final    NO GROWTH 5 DAYS Performed at [facility], [place] [address] [place], [place] [address]    Report Status 02/16/2021 FINAL  Final  Resp Panel by RT-PCR (Flu A&B, Covid) Nasopharyngeal Swab     Status: None   Collection Time: 02/10/21 11:00 PM   Specimen: Nasopharyngeal Swab; Nasopharyngeal(NP) swabs in vial transport medium  Result Value Ref Range Status   SARS Coronavirus 2 by RT PCR NEGATIVE NEGATIVE Final    Comment: (NOTE) SARS-CoV-2 target nucleic acids are NOT DETECTED.  The SARS-CoV-2 RNA is generally detectable in upper respiratory specimens during the acute phase of infection. The lowest concentration of SARS-CoV-2 viral copies this assay can detect is 138 copies/mL. A negative result does not preclude SARS-Cov-2 infection and should not be used as the sole basis for treatment or other patient management decisions. A negative result may occur with  improper specimen collection/handling, submission of specimen other than nasopharyngeal swab, presence of viral mutation(s) within the areas targeted by this assay, and inadequate number of viral copies(<138 copies/mL). A negative result must be combined with clinical observations, patient history, and epidemiological information. The expected result is Negative.  Fact Sheet for Patients:  EntrepreneurPulse.com.au  Fact Sheet for Healthcare Providers:  IncredibleEmployment.be  This test is no t yet approved or cleared by the  [place] Architectural technologist and  has been authorized for detection and/or diagnosis of SARS-CoV-2 by FDA under an Print production planner (EUA). This EUA will remain  in effect (meaning this test can be  used) for the duration of the COVID-19 declaration under Section 564(b)(1) of the Act, 21 U.S.C.section 360bbb-3(b)(1), unless the authorization is terminated  or revoked sooner.       Influenza A by PCR NEGATIVE NEGATIVE Final   Influenza B by PCR NEGATIVE NEGATIVE Final    Comment: (NOTE) The Xpert Xpress SARS-CoV-2/FLU/RSV plus assay is intended as an aid in the diagnosis of influenza from Nasopharyngeal swab specimens and should not be used as a sole basis for treatment. Nasal washings and aspirates are unacceptable for Xpert Xpress SARS-CoV-2/FLU/RSV testing.  Fact Sheet for Patients: EntrepreneurPulse.com.au  Fact Sheet for Healthcare Providers: IncredibleEmployment.be  This test is not yet approved or cleared by the [place] FDA and has been authorized for detection and/or diagnosis of SARS-CoV-2 by FDA under an Emergency Use Authorization (EUA). This EUA will remain in effect (meaning this test can be used) for the duration of the COVID-19 declaration under Section 564(b)(1) of the Act, 21 U.S.C. section 360bbb-3(b)(1), unless the authorization is terminated or revoked.  Performed at [facility], [place] [address], [place], [place] [address]          Radiology Studies: No results found.      Scheduled Meds: . magnesium oxide  400 mg Oral Daily  . metoprolol tartrate  12.5 mg Oral BID  . mirtazapine  7.5 mg Oral QHS  . potassium chloride  40 mEq Oral Daily  . [START ON 02/18/2021] QUEtiapine  25 mg Oral QHS   Continuous Infusions: . sodium chloride    . dextrose 5% lactated ringers    . magnesium sulfate bolus IVPB       LOS: 7 days   Time spent: 25 mins Greater than 50% of this time was spent in counseling, explanation of diagnosis, planning of further management, and coordination of care.   Voice Recognition Viviann Spare dictation system was used to create this note, attempts have been made to  correct errors. Please contact the author with questions and/or clarifications.   Florencia Reasons, MD PhD FACP [facility]  Available via Epic secure chat 7am-7pm for nonurgent issues Please page for urgent issues To page the attending provider between 7A-7P or the covering provider during after hours 7P-7A, please log into the web site [email or web address] and access using universal [place] password for that web site. If you do not have the password, please call the hospital operator.    02/17/2021, 5:32 PM

## 2021-02-17 NOTE — Progress Notes (Signed)
[facility])  Referral received for hospice services at home.  Spoke with dtr [name], she confirmed interest.   Explained services, provided support and answered questions.  Plan to dc home once DME is set up.  Family requests the following DME: hospital bed, wheelchair, walker, 3n1.   Please send completed DNR home with patient.   If needed, please arrange for comfort meds so there is no lapse in her symptom management prior to hospice services starting (likely the day after she discharges)..  Please reach out with any hospice related questions or concerns.  Venia Carbon RN, BSN, [place] [facility] Liaison

## 2021-02-18 DIAGNOSIS — D649 Anemia, unspecified: Secondary | ICD-10-CM | POA: Diagnosis not present

## 2021-02-18 MED ORDER — METOPROLOL TARTRATE 25 MG PO TABS: 12.5000 mg | ORAL_TABLET | Freq: Two times a day (BID) | ORAL | 0 refills | Status: AC

## 2021-02-18 MED ORDER — POTASSIUM CHLORIDE 20 MEQ PO PACK: 20.0000 meq | PACK | Freq: Every day | ORAL | 0 refills | Status: AC

## 2021-02-18 MED ORDER — QUETIAPINE FUMARATE 25 MG PO TABS: 25.0000 mg | ORAL_TABLET | Freq: Every day | ORAL | 0 refills | Status: AC

## 2021-02-18 MED ORDER — MIRTAZAPINE 7.5 MG PO TABS
7.5000 mg | ORAL_TABLET | Freq: Every day | ORAL | 0 refills | Status: AC
Start: 2021-02-18 — End: ?
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
## 2021-02-18 NOTE — TOC Transition Note (Addendum)
Transition of Care [facility]) - CM/SW Discharge Note   Patient Details  Name: [name] MRN: [id number] Date of Birth: Jun 10, 1931  Transition of Care [facility]) CM/SW Contact:  Laura Mage, LCSW Phone Number: 02/18/2021, 12:21 PM   Clinical Narrative:   Patient who is stable for d/c today will return home with [facility] hospice in place.  Confirmed with [name]. Confirmed with daughter [name]. Family will receive patient home via [facility] upon receiving delivery of DME, including hospital bed.  Instructions left with unit secretary to call for PTAR once we get confirmation. TOC will continue to follow during the course of hospitalization.  Addendum:  Daughter called to confirm that bed is 10 minutes out.  PTAR called.  Nursing alerted.  TOC sign off.     Final next level of care: Home w Hospice Care Barriers to Discharge: Barriers Resolved   Patient Goals and CMS Choice Patient states their goals for this hospitalization and ongoing recovery are:: residential hospice CMS Medicare.gov Compare Post Acute Care list provided to:: Patient Represenative (must comment) Choice offered to / list presented to : Adult Children  Discharge Placement                       Discharge Plan and Services In-house Referral: Clinical Social Work Discharge Planning Services: CM Consult Post Acute Care Choice: Hospice          DME Arranged: N/A DME Agency: NA       HH Arranged: NA HH Agency: NA        Social Determinants of Health (SDOH) Interventions     Readmission Risk Interventions No flowsheet data found.

## 2021-02-18 NOTE — Care Management Important Message (Signed)
Medicare IM given to the patient by Arabell Neria. 

## 2021-02-18 NOTE — Progress Notes (Signed)
[facility]) Hospital Liaison RN note  Hospice eligibility approved for hospice services at home. DME has been ordered and should be delivered to the home today.  Updated daughter [name] and Al Decant.  Please call with any hospice related questions or concerns.  Thank you. Margaretmary Eddy, BSN, RN  [facility] Liaison  [phone]

## 2021-04-22 DEATH — deceased

## 2022-07-12 IMAGING — CT CT HEAD W/O CM
3 of 4 series · 15 of 47 positions shown, 18 images · non-contrast
Comparison: MRI 12/31/2010

CLINICAL DATA: Altered mental status, dementia

EXAM:
CT HEAD WITHOUT CONTRAST
TECHNIQUE: Contiguous axial images were obtained from the base of the skull
through the vertex without intravenous contrast.

[Series 2: head wo · axial · 0.42mm/px · z∈[-152,-27]mm · 9 of 33 slices shown, 12 images]
[im 4/33  brain]
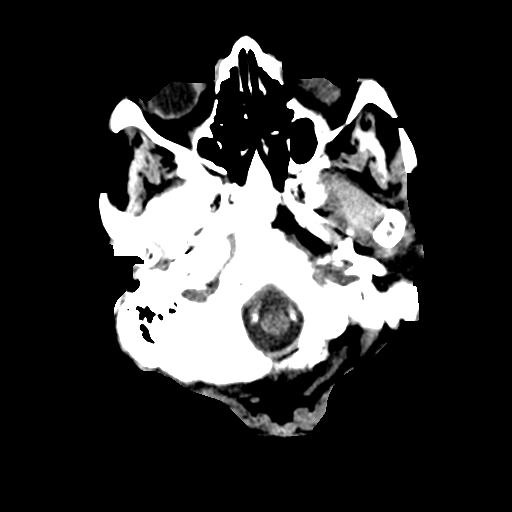
[im 4/33  bone]
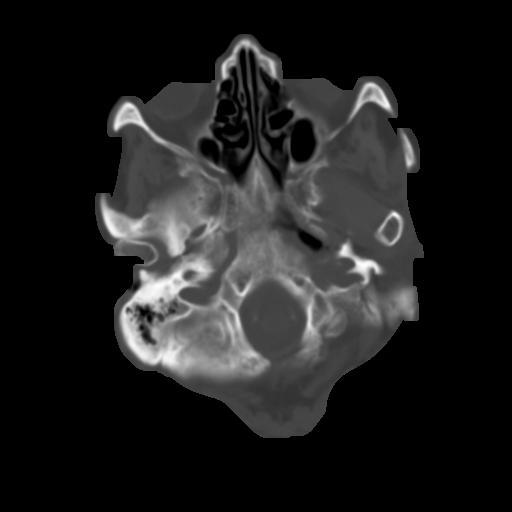
[im 7/33  brain]
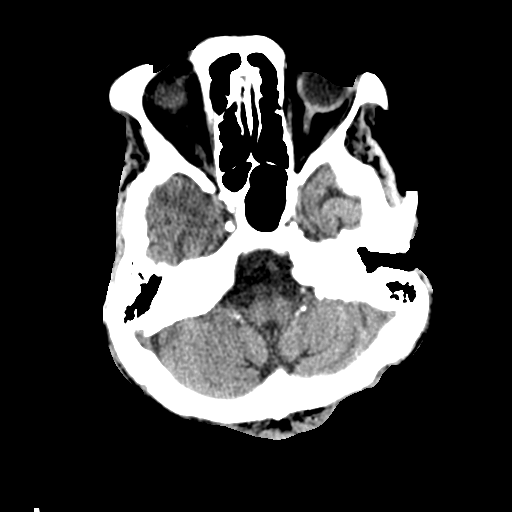
[im 10/33  brain]
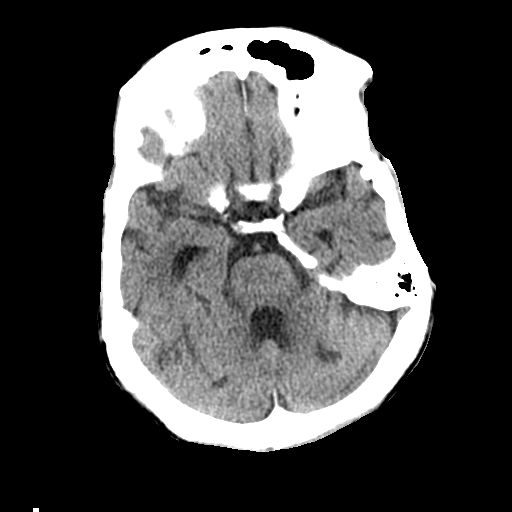
[im 13/33  brain]
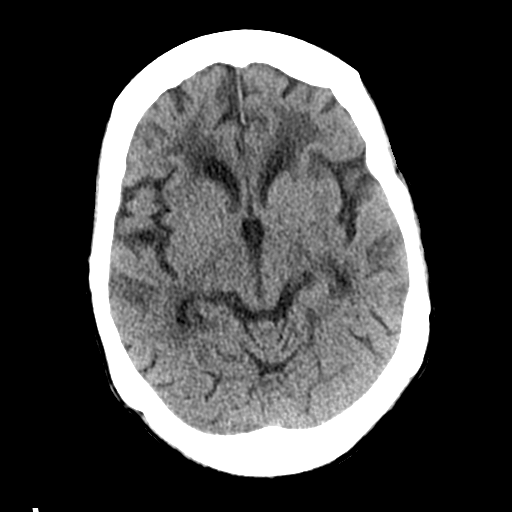
[im 17/33  brain]
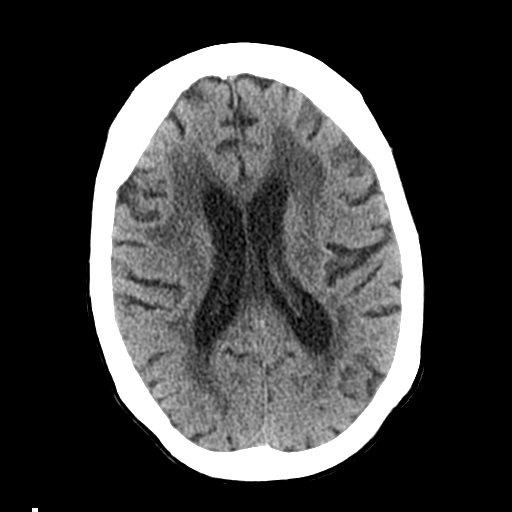
[im 17/33  bone]
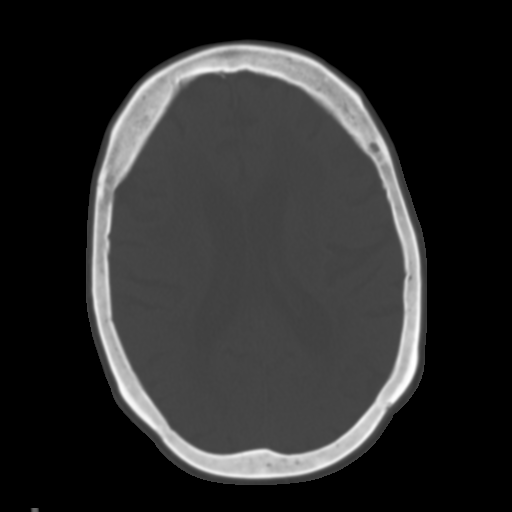
[im 20/33  brain]
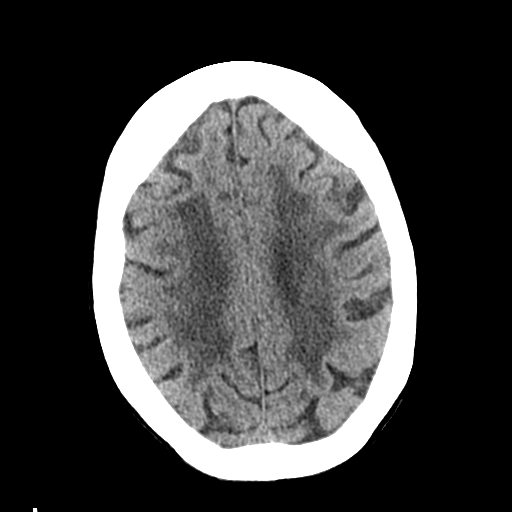
[im 23/33  brain]
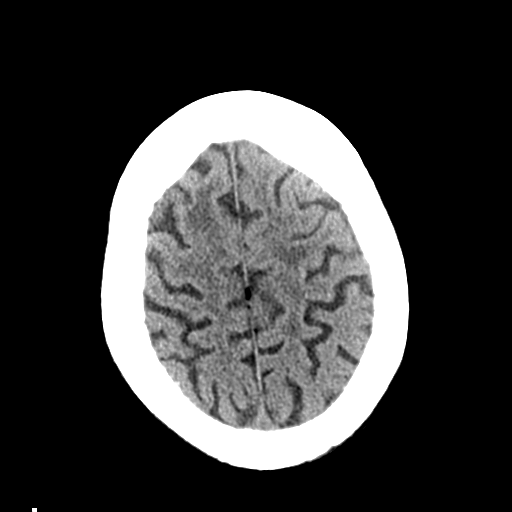
[im 26/33  brain]
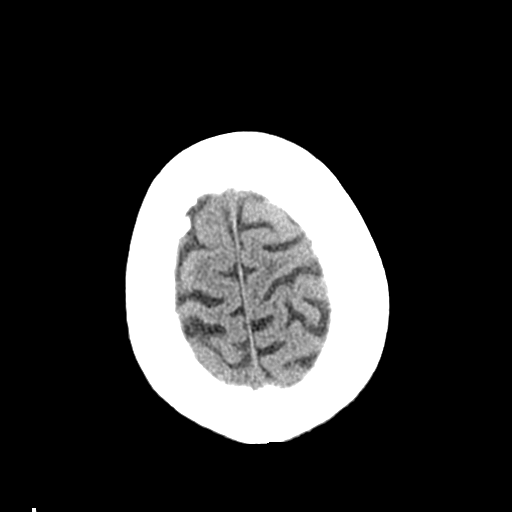
[im 29/33  brain]
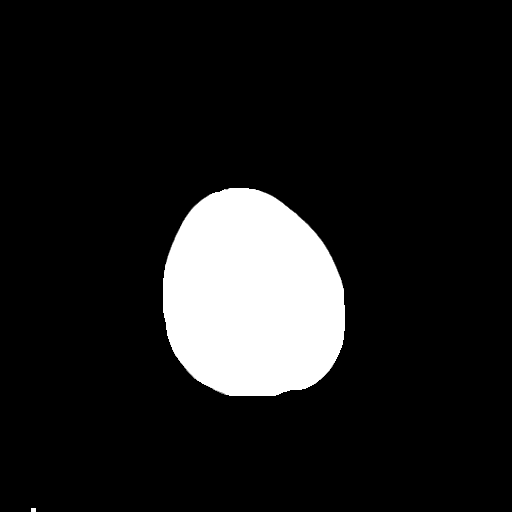
[im 29/33  bone]
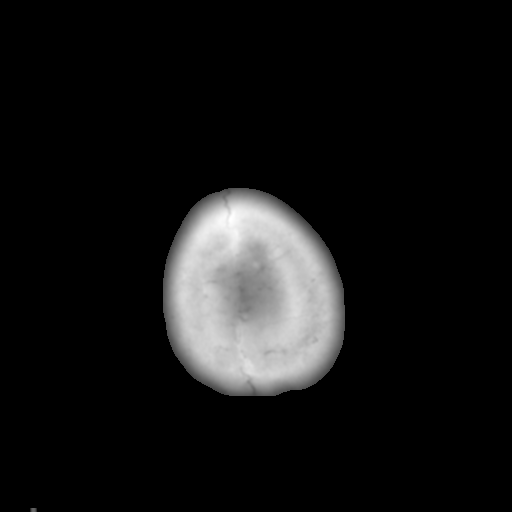

[Series 5: coronal soft tissue · coronal · 0.33mm/px · 3 of 69 slices shown]
[im 23/69  brain]
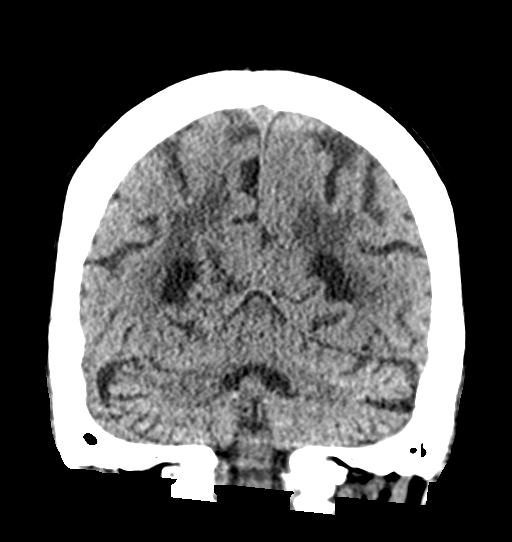
[im 31/69  brain]
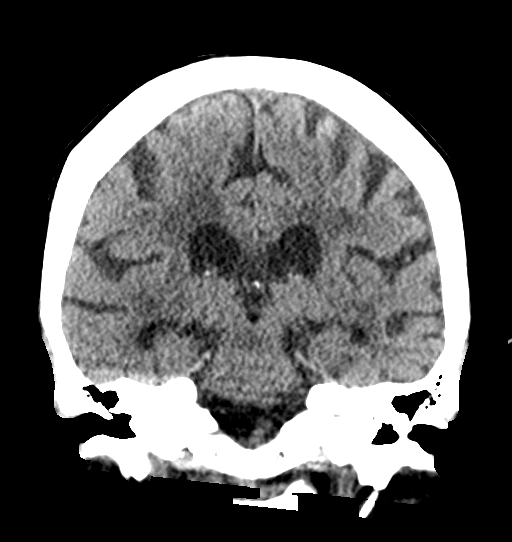
[im 38/69  brain]
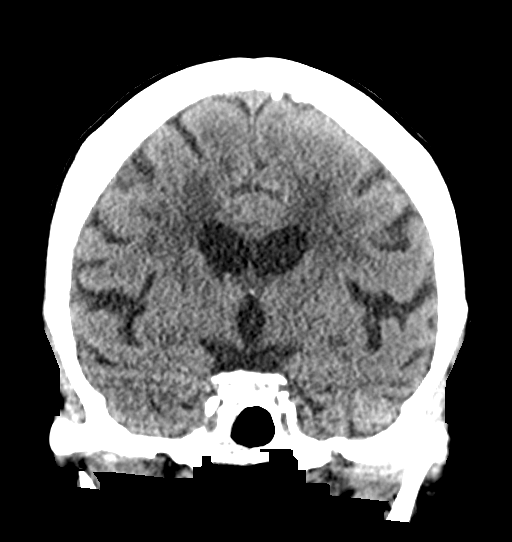

[Series 6: sagittal soft tissue · sagittal · 0.35mm/px · 3 of 55 slices shown]
[im 19/55  brain]
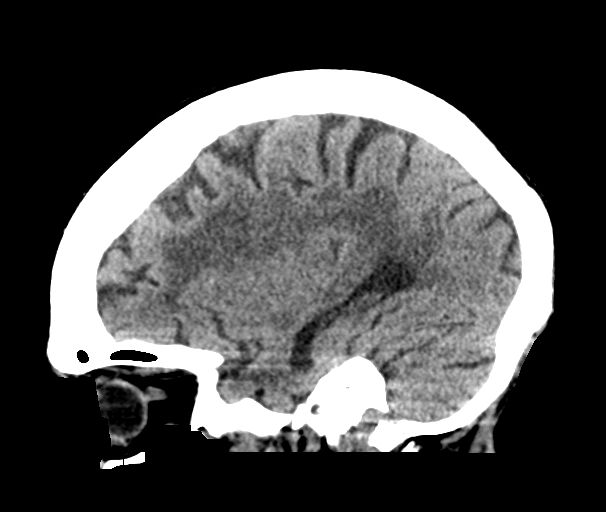
[im 28/55  brain]
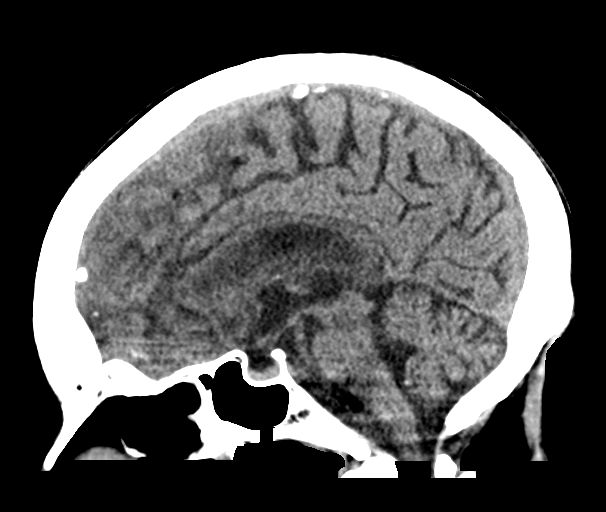
[im 37/55  brain]
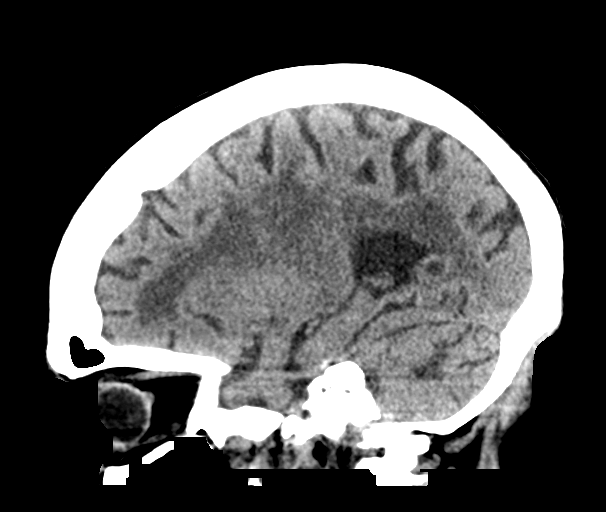

[15 of 47 positions shown; findings below may reference images not displayed]

FINDINGS: Brain: Normal anatomic configuration. Parenchymal volume loss is
commensurate with the patient's age. Extensive bilateral subcortical
and periventricular white matter changes are present likely
reflecting the sequela of small vessel ischemia, stable since prior
examination. Remote left cerebellar infarct noted. Tiny remote right
cerebellar infarct has developed in the interval. No abnormal intra
or extra-axial mass lesion or fluid collection. No abnormal mass
effect or midline shift. No evidence of acute intracranial
hemorrhage or infarct. Ventricular size is normal. Cerebellum
unremarkable.

Vascular: No asymmetric hyperdense vasculature at the skull base.

Skull: Intact

Sinuses/Orbits: Paranasal sinuses are clear. Orbits are
unremarkable.

Other: Mastoid air cells and middle ear cavities are clear.
IMPRESSION: No acute intracranial hemorrhage or infarct.

Stable advanced periventricular and subcortical white matter
changes, most in keeping with small vessel ischemic change.

Bilateral cerebellar infarcts, new on the right since prior
examination, though these are both remote in nature.
